# Patient Record
Sex: Male | Born: 1960 | Race: White | Hispanic: No | Smoking: Former smoker
Health system: Southern US, Community
[De-identification: ages and names within clinical notes are randomized; demographics above are authoritative.]

## PROBLEM LIST (undated history)

## (undated) DIAGNOSIS — I10 Essential (primary) hypertension: Secondary | ICD-10-CM

## (undated) DIAGNOSIS — I509 Heart failure, unspecified: Secondary | ICD-10-CM

## (undated) DIAGNOSIS — E119 Type 2 diabetes mellitus without complications: Secondary | ICD-10-CM

## (undated) DIAGNOSIS — M62838 Other muscle spasm: Secondary | ICD-10-CM

## (undated) DIAGNOSIS — F4312 Post-traumatic stress disorder, chronic: Secondary | ICD-10-CM

## (undated) HISTORY — DX: Other muscle spasm: M62.838

## (undated) HISTORY — DX: Type 2 diabetes mellitus without complications: E11.9

## (undated) HISTORY — DX: Post-traumatic stress disorder, chronic: F43.12

## (undated) HISTORY — DX: Heart failure, unspecified: I50.9

---

## 1998-03-02 DIAGNOSIS — E785 Hyperlipidemia, unspecified: Secondary | ICD-10-CM

## 1998-03-02 HISTORY — DX: Hyperlipidemia, unspecified: E78.5

## 2001-03-02 DIAGNOSIS — F32A Depression, unspecified: Secondary | ICD-10-CM

## 2001-03-02 DIAGNOSIS — F419 Anxiety disorder, unspecified: Secondary | ICD-10-CM

## 2001-03-02 DIAGNOSIS — M1990 Unspecified osteoarthritis, unspecified site: Secondary | ICD-10-CM

## 2001-03-02 HISTORY — DX: Anxiety disorder, unspecified: F41.9

## 2001-03-02 HISTORY — DX: Depression, unspecified: F32.A

## 2001-03-02 HISTORY — DX: Unspecified osteoarthritis, unspecified site: M19.90

## 2007-03-03 DIAGNOSIS — G4730 Sleep apnea, unspecified: Secondary | ICD-10-CM

## 2007-03-03 DIAGNOSIS — T7840XA Allergy, unspecified, initial encounter: Secondary | ICD-10-CM

## 2007-03-03 HISTORY — DX: Allergy, unspecified, initial encounter: T78.40XA

## 2007-03-03 HISTORY — DX: Sleep apnea, unspecified: G47.30

## 2012-03-02 DIAGNOSIS — C801 Malignant (primary) neoplasm, unspecified: Secondary | ICD-10-CM

## 2012-03-02 HISTORY — DX: Malignant (primary) neoplasm, unspecified: C80.1

## 2015-03-03 DIAGNOSIS — D689 Coagulation defect, unspecified: Secondary | ICD-10-CM

## 2015-03-03 HISTORY — DX: Coagulation defect, unspecified: D68.9

## 2015-12-23 ENCOUNTER — Emergency Department (HOSPITAL_COMMUNITY)

## 2015-12-23 ENCOUNTER — Emergency Department (HOSPITAL_COMMUNITY)
Admission: EM | Admit: 2015-12-23 | Discharge: 2015-12-23 | Disposition: A | Discharge status: Home / Self Care | Attending: Emergency Medicine | Admitting: Emergency Medicine

## 2015-12-23 ENCOUNTER — Encounter (HOSPITAL_COMMUNITY): Payer: Self-pay | Admitting: Emergency Medicine

## 2015-12-23 DIAGNOSIS — R079 Chest pain, unspecified: Secondary | ICD-10-CM

## 2015-12-23 DIAGNOSIS — R0789 Other chest pain: Secondary | ICD-10-CM | POA: Diagnosis not present

## 2015-12-23 DIAGNOSIS — I10 Essential (primary) hypertension: Secondary | ICD-10-CM | POA: Diagnosis not present

## 2015-12-23 DIAGNOSIS — R072 Precordial pain: Secondary | ICD-10-CM | POA: Diagnosis present

## 2015-12-23 HISTORY — DX: Essential (primary) hypertension: I10

## 2015-12-23 LAB — I-STAT TROPONIN, ED
TROPONIN I, POC: 0 ng/mL (ref 0.00–0.08)
TROPONIN I, POC: 0 ng/mL (ref 0.00–0.08)

## 2015-12-23 LAB — CBC
HEMATOCRIT: 44.7 % (ref 39.0–52.0)
Hemoglobin: 15.5 g/dL (ref 13.0–17.0)
MCH: 30.9 pg (ref 26.0–34.0)
MCHC: 34.7 g/dL (ref 30.0–36.0)
MCV: 89 fL (ref 78.0–100.0)
Platelets: 171 10*3/uL (ref 150–400)
RBC: 5.02 MIL/uL (ref 4.22–5.81)
RDW: 12.3 % (ref 11.5–15.5)
WBC: 7.3 10*3/uL (ref 4.0–10.5)

## 2015-12-23 LAB — BASIC METABOLIC PANEL
ANION GAP: 8 (ref 5–15)
BUN: 17 mg/dL (ref 6–20)
CALCIUM: 9.2 mg/dL (ref 8.9–10.3)
CO2: 26 mmol/L (ref 22–32)
Chloride: 102 mmol/L (ref 101–111)
Creatinine, Ser: 1 mg/dL (ref 0.61–1.24)
GFR calc Af Amer: >60 mL/min (ref >60)
Glucose, Bld: 145 mg/dL — ABNORMAL HIGH (ref 65–99)
POTASSIUM: 4.4 mmol/L (ref 3.5–5.1)
SODIUM: 136 mmol/L (ref 135–145)

## 2015-12-23 NOTE — ED Triage Notes (Signed)
Pt states yesterday he did not feel well he felt like he was tired and this morning at 8am he started having intermittent central chest pain with nausea and left arm and jaw pain. At this time pt only has pain in left jaw.

## 2015-12-23 NOTE — ED Provider Notes (Signed)
[facility] DEPT Provider Note   CSN: [id number] Arrival date & time: 12/23/15  1403     History   Chief Complaint Chief Complaint  Patient presents with  . Chest Pain    HPI Dean Knox is a 55 y.o. male.  Patient presents with malaise starting yesterday and chest pressure radiating to left arm/jaw intermittently for the past day. No exacerbating or reliving factors. No recent cough or fever. No dyspnea. Denies chest pain on arrival. Pain is not exertional.   The history is provided by the patient. No language interpreter was used.  Chest Pain   This is a new problem. The current episode started 12 to 24 hours ago. The problem occurs hourly. The problem has not changed since onset.Associated with: Nothing. The pain is present in the substernal region. The pain is at a severity of 5/10. The pain is moderate. The quality of the pain is described as pressure-like. The pain radiates to the left jaw and left arm. Exacerbated by: No exacerbating factors. Associated symptoms include malaise/fatigue and nausea. Pertinent negatives include no abdominal pain, no cough, no fever, no lower extremity edema and no shortness of breath. He has tried rest for the symptoms. The treatment provided no relief. Risk factors include male gender, lack of exercise, obesity and sedentary lifestyle.  His past medical history is significant for hypertension.  Pertinent negatives for past medical history include no CAD, no MI and no recent injury.  Pertinent negatives for family medical history include: no early MI.  Procedure history is positive for echocardiogram and exercise treadmill test. Past workup comments: Stress test in 2014.    Past Medical History:  Diagnosis Date  . Hypertension     There are no active problems to display for this patient.   No past surgical history on file.     Home Medications    Prior to Admission medications   Medication Sig Start Date End Date Taking?  Authorizing Provider  ALPRAZolam Duanne Moron) 1 MG tablet Take 1 mg by mouth 2 (two) times daily as needed for anxiety.  10/07/15  Yes Historical Provider, MD  butalbital-aspirin-caffeine Acquanetta Chain) 50-325-40 MG capsule Take 2 capsules by mouth 2 (two) times daily as needed for headache (TAKE AT ONSET).   Yes Historical Provider, MD  citalopram (CELEXA) 40 MG tablet Take 40 mg by mouth at bedtime. 10/07/15  Yes Historical Provider, MD  DHS TAR 0.5 % shampoo once a week. 10/11/15  Yes Historical Provider, MD  fexofenadine (ALLEGRA) 180 MG tablet Take 180 mg by mouth daily as needed for allergies or rhinitis.   Yes Historical Provider, MD  fluticasone (FLONASE) 50 MCG/ACT nasal spray Place 2 sprays into both nostrils daily as needed for allergies or rhinitis.   Yes Historical Provider, MD  lisinopril (PRINIVIL,ZESTRIL) 20 MG tablet Take 20 mg by mouth at bedtime. 09/26/15  Yes Historical Provider, MD  promethazine (PHENERGAN) 25 MG tablet Take 25 mg by mouth 2 (two) times daily as needed (for headache-induced nausea).  10/11/15  Yes Historical Provider, MD  traZODone (DESYREL) 100 MG tablet Take 100 mg by mouth at bedtime. 09/26/15  Yes Historical Provider, MD  XARELTO 20 MG TABS tablet Take 20 mg by mouth every morning. 11/13/15  Yes Historical Provider, MD    Family History No family history on file.  Social History Social History  Substance Use Topics  . Smoking status: Not on file  . Smokeless tobacco: Not on file  . Alcohol use Not on file  Allergies   Penicillins   Review of Systems Review of Systems  Constitutional: Positive for fatigue and malaise/fatigue. Negative for fever.  HENT: Negative.   Respiratory: Negative for cough and shortness of breath.   Cardiovascular: Positive for chest pain. Negative for leg swelling.  Gastrointestinal: Positive for nausea. Negative for abdominal pain.  Genitourinary: Negative.   Musculoskeletal: Negative.   Skin: Negative.   Allergic/Immunologic:  Negative for immunocompromised state.  Neurological: Negative.   Hematological: Does not bruise/bleed easily.  Psychiatric/Behavioral: Negative.      Physical Exam Updated Vital Signs BP 150/99 (BP Location: Right Arm)   Pulse (!) 55   Temp 97.7 F (36.5 C) (Oral)   Resp 19   Ht 6\' 4"  (1.93 m)   Wt (!) 145.2 kg   SpO2 99%   BMI 38.95 kg/m   Physical Exam  Constitutional: He is oriented to person, place, and time. He appears well-developed and well-nourished. No distress.  HENT:  Head: Normocephalic and atraumatic.  Eyes: Conjunctivae and EOM are normal. Pupils are equal, round, and reactive to light. No scleral icterus.  Neck: Normal range of motion. Neck supple.  Cardiovascular: Normal rate, regular rhythm, normal heart sounds and intact distal pulses.  Exam reveals no gallop.   No murmur heard. Pulmonary/Chest: Effort normal and breath sounds normal. No respiratory distress. He has no wheezes. He has no rales. He exhibits no tenderness.  Abdominal: Soft. Bowel sounds are normal. He exhibits no distension and no mass. There is no tenderness. There is no guarding.  Musculoskeletal: Normal range of motion. He exhibits no edema.  Neurological: He is alert and oriented to person, place, and time.  5/5 strength of bilateral shoulder flexion/extension, elbow flexion/extension grip strength. 5/5 strength of hip flexion/extension and dorsiflexion/plantarflexion. Sensation intact in all extremities.  Skin: Skin is warm and dry. No rash noted. He is not diaphoretic. No pallor.  Psychiatric: His behavior is normal. Judgment and thought content normal. He exhibits a depressed mood.     ED Treatments / Results  Labs (all labs ordered are listed, but only abnormal results are displayed) Labs Reviewed  BASIC METABOLIC PANEL - Abnormal; Notable for the following:       Result Value   Glucose, Bld 145 (*)    All other components within normal limits  CBC  I-STAT TROPOININ, ED  Randolm Idol, ED    EKG  EKG Interpretation None       Radiology Dg Chest 2 View  Result Date: 12/23/2015 CLINICAL DATA:  Left-sided chest and arm pain for 1 day. Hypertension. EXAM: CHEST  2 VIEW COMPARISON:  None. FINDINGS: The heart size and mediastinal contours are within normal limits. Both lungs are clear. The visualized skeletal structures are unremarkable. IMPRESSION: No active cardiopulmonary disease. Electronically Signed   By: Earle Gell M.D.   On: 12/23/2015 15:06    Procedures Procedures (including critical care time)  Medications Ordered in ED Medications - No data to display   Initial Impression / Assessment and Plan / ED Course  I have reviewed the triage vital signs and the nursing notes.  Pertinent labs & imaging results that were available during my care of the patient were reviewed by me and considered in my medical decision making (see chart for details).  Clinical Course    Patient presents with one day of atypical chest pain. He also notes two days of general malaise without other symptoms. He is well-appearing on arrival with normal vital signs. EKG is  unremarkable without signs of any ischemic changes, normal ST segments and no T wave changes. Chest x-ray unremarkable without any signs of pneumonia, pneumothorax, cardiomegaly, pulmonary edema. HEAR score is 3 indicating low risk for ACS. Troponins at 0 and 3 hours were negative. Labs were unremarkable. Discussed return precautions with patient for signs of worsening symptoms. Encouraged him to follow up with his PCP for risk stratification. He expressed understanding of these instructions and is in good condition for discharge home.  Final Clinical Impressions(s) / ED Diagnoses   Final diagnoses:  Nonspecific chest pain    New Prescriptions Discharge Medication List as of 12/23/2015  6:10 PM       Harlin Heys, MD 12/23/15 1904    Veryl Speak, MD 12/23/15 [phone]

## 2019-01-05 ENCOUNTER — Encounter (HOSPITAL_COMMUNITY): Payer: Self-pay

## 2019-01-05 ENCOUNTER — Other Ambulatory Visit: Payer: Self-pay

## 2019-01-05 ENCOUNTER — Ambulatory Visit (HOSPITAL_COMMUNITY)
Admission: EM | Admit: 2019-01-05 | Discharge: 2019-01-05 | Disposition: A | Discharge status: Home / Self Care | Attending: Family Medicine | Admitting: Family Medicine

## 2019-01-05 DIAGNOSIS — J309 Allergic rhinitis, unspecified: Secondary | ICD-10-CM

## 2019-01-05 MED ORDER — FLUTICASONE PROPIONATE 50 MCG/ACT NA SUSP: 1.0000 | Freq: Every day | NASAL | 0 refills | Status: DC

## 2019-01-05 MED ORDER — METHYLPREDNISOLONE SODIUM SUCC 125 MG IJ SOLR
INTRAMUSCULAR | Status: AC
  Filled 2019-01-05: qty 2

## 2019-01-05 MED ORDER — METHYLPREDNISOLONE SODIUM SUCC 125 MG IJ SOLR
125.0000 mg | Freq: Once | INTRAMUSCULAR | Status: AC
  Administered 2019-01-05: 125 mg via INTRAMUSCULAR

## 2019-01-05 MED ORDER — FEXOFENADINE HCL 180 MG PO TABS: 180.0000 mg | ORAL_TABLET | Freq: Every day | ORAL | 0 refills | Status: DC

## 2019-01-05 MED ORDER — METHYLPREDNISOLONE 4 MG PO TBPK: ORAL_TABLET | ORAL | 0 refills | Status: DC

## 2019-01-05 MED ORDER — OLOPATADINE HCL 0.1 % OP SOLN: 1.0000 [drp] | Freq: Two times a day (BID) | OPHTHALMIC | 12 refills | Status: DC

## 2019-01-05 NOTE — ED Provider Notes (Signed)
[facility]    CSN: [id number] Arrival date & time: 01/05/19  1645      History   Chief Complaint Chief Complaint  Patient presents with  . APPT (1510) runny nose, eyes itching    HPI Dean Knox is a 58 y.o. male history of HTN, allergic rhinitis, presenting today for evaluation of congestion and allergies.  Patient states over the past 2 days he has developed significant watery/itchy eyes as well as nasal congestion.  Feels very similar to his allergy flares in the past.  He has been using Allegra without relief.  States that if he is out of his Flonase and the typical eyedrops he uses.  He also states that he typically has significant improvement in symptoms with steroids and typically gets a Solu-Medrol injection.  He denies any fevers chills or body aches.  Denies any cough, shortness of breath or chest pain.  Denies known exposure to Covid.  HPI  Past Medical History:  Diagnosis Date  . Hypertension     There are no active problems to display for this patient.   History reviewed. No pertinent surgical history.     Home Medications    Prior to Admission medications   Medication Sig Start Date End Date Taking? Authorizing Provider  butalbital-aspirin-caffeine [facility]) 50-325-40 MG capsule Take 2 capsules by mouth 2 (two) times daily as needed for headache (TAKE AT ONSET).    [provider]  citalopram (CELEXA) 40 MG tablet Take 40 mg by mouth at bedtime. 10/07/15   [provider]  DHS TAR 0.5 % shampoo once a week. 10/11/15   [provider]  fexofenadine (ALLEGRA) 180 MG tablet Take 1 tablet (180 mg total) by mouth daily. 01/05/19   Wieters, Hallie C, PA-C  fluticasone (FLONASE) 50 MCG/ACT nasal spray Place 1-2 sprays into both nostrils daily for 7 days. 01/05/19 01/12/19  Wieters, Hallie C, PA-C  lisinopril (PRINIVIL,ZESTRIL) 20 MG tablet Take 20 mg by mouth at bedtime. 09/26/15   [provider]  methylPREDNISolone  (MEDROL DOSEPAK) 4 MG TBPK tablet Take as directed, begin 11/6 in morning 01/05/19   Wieters, Hallie C, PA-C  olopatadine (PATANOL) 0.1 % ophthalmic solution Place 1 drop into both eyes 2 (two) times daily. 01/05/19   Wieters, Hallie C, PA-C  promethazine (PHENERGAN) 25 MG tablet Take 25 mg by mouth 2 (two) times daily as needed (for headache-induced nausea).  10/11/15   [provider]  traZODone (DESYREL) 100 MG tablet Take 100 mg by mouth at bedtime. 09/26/15   [provider]  XARELTO 20 MG TABS tablet Take 20 mg by mouth every morning. 11/13/15   [provider]    Family History Family History  Problem Relation Age of Onset  . Healthy Mother   . Healthy Father     Social History Social History   Tobacco Use  . Smoking status: Never Smoker  . Smokeless tobacco: Never Used  Substance Use Topics  . Alcohol use: Yes    Comment: occ  . Drug use: Not on file     Allergies   Penicillins   Review of Systems Review of Systems  Constitutional: Negative for activity change, appetite change, chills, fatigue and fever.  HENT: Positive for congestion, rhinorrhea, sinus pressure and sneezing. Negative for ear pain, sore throat and trouble swallowing.   Eyes: Positive for discharge and itching. Negative for redness.  Respiratory: Negative for cough, chest tightness and shortness of breath.   Cardiovascular: Negative for  chest pain.  Gastrointestinal: Negative for abdominal pain, diarrhea, nausea and vomiting.  Musculoskeletal: Negative for myalgias.  Skin: Negative for rash.  Neurological: Negative for dizziness, light-headedness and headaches.     Physical Exam Triage Vital Signs ED Triage Vitals  Enc Vitals Group     BP --      Pulse Rate 01/05/19 1724 87     Resp 01/05/19 1724 16     Temp 01/05/19 1724 98.2 F (36.8 C)     Temp Source 01/05/19 1724 Oral     SpO2 01/05/19 1724 97 %     Weight --      Height --      Head Circumference --       Peak Flow --      Pain Score 01/05/19 1727 0     Pain Loc --      Pain Edu? --      Excl. in [place]? --    No data found.  Updated Vital Signs Pulse 87   Temp 98.2 F (36.8 C) (Oral)   Resp 16   SpO2 97%   Visual Acuity Right Eye Distance:   Left Eye Distance:   Bilateral Distance:    Right Eye Near:   Left Eye Near:    Bilateral Near:     Physical Exam Vitals signs and nursing note reviewed.  Constitutional:      Appearance: He is well-developed.  HENT:     Head: Normocephalic and atraumatic.     Ears:     Comments: Bilateral ears without tenderness to palpation of external auricle, tragus and mastoid, EAC's without erythema or swelling, TM's with good bony landmarks and cone of light. Non erythematous.     Nose:     Comments: Nasal mucosa erythematous, rhinorrhea present    Mouth/Throat:     Comments: Oral mucosa pink and moist, no tonsillar enlargement or exudate. Posterior pharynx patent and nonerythematous, no uvula deviation or swelling. Normal phonation.  Eyes:     Extraocular Movements: Extraocular movements intact.     Conjunctiva/sclera: Conjunctivae normal.     Pupils: Pupils are equal, round, and reactive to light.  Neck:     Musculoskeletal: Neck supple.  Cardiovascular:     Rate and Rhythm: Normal rate and regular rhythm.     Heart sounds: No murmur.  Pulmonary:     Effort: Pulmonary effort is normal. No respiratory distress.     Breath sounds: Normal breath sounds.     Comments: Breathing comfortably at rest, CTABL, no wheezing, rales or other adventitious sounds auscultated Abdominal:     Palpations: Abdomen is soft.     Tenderness: There is no abdominal tenderness.  Skin:    General: Skin is warm and dry.  Neurological:     Mental Status: He is alert.      UC Treatments / Results  Labs (all labs ordered are listed, but only abnormal results are displayed) Labs Reviewed - No data to display  EKG   Radiology No results found.   Procedures Procedures (including critical care time)  Medications Ordered in UC Medications  methylPREDNISolone sodium succinate (SOLU-MEDROL) 125 mg/2 mL injection 125 mg (has no administration in time range)    Initial Impression / Assessment and Plan / UC Course  I have reviewed the triage vital signs and the nursing notes.  Pertinent labs & imaging results that were available during my care of the patient were reviewed by me and considered in my medical  decision making (see chart for details).     2 days of URI symptoms.  Symptoms are most consistent with allergic rhinitis, but given current Covid pandemic did offer Covid testing, patient declined.  Will proceed with allergic rhinitis treatment.  Will provide Solu-Medrol and Medrol Dosepak as this is significantly outpatient in the past.  We will continue symptomatic and supportive care with Allegra, Flonase as well as olopatadine.  Continue to monitor, advised to follow-up if developing symptoms deviating from typical allergy symptoms.  Discussed strict return precautions. Patient verbalized understanding and is agreeable with plan.  Final Clinical Impressions(s) / UC Diagnoses   Final diagnoses:  Allergic rhinitis, unspecified seasonality, unspecified trigger     Discharge Instructions     We gave you solumedrol today Begin medrol dose pack taper in the morning Continue allegra, flonase Olopatadine as needed  Follow up if symptoms not resolving   ED Prescriptions    Medication Sig Dispense Auth. Provider   fluticasone (FLONASE) 50 MCG/ACT nasal spray Place 1-2 sprays into both nostrils daily for 7 days. 1 g Wieters, Hallie C, PA-C   olopatadine (PATANOL) 0.1 % ophthalmic solution Place 1 drop into both eyes 2 (two) times daily. 5 mL Wieters, Hallie C, PA-C   methylPREDNISolone (MEDROL DOSEPAK) 4 MG TBPK tablet Take as directed, begin 11/6 in morning 21 tablet Wieters, Hallie C, PA-C   fexofenadine (ALLEGRA) 180 MG  tablet Take 1 tablet (180 mg total) by mouth daily. 30 tablet Wieters, Finley C, PA-C     PDMP not reviewed this encounter.   Janith Lima, PA-C 01/05/19 1751

## 2019-01-05 NOTE — ED Triage Notes (Signed)
Pt presents to UC w/ c/o runny nose, eyes itching since yesterday. Pt states he has these symptoms every year and gets a solumedrol which helps. 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

## 2019-01-05 NOTE — Discharge Instructions (Signed)
We gave you solumedrol today Begin medrol dose pack taper in the morning Continue allegra, flonase Olopatadine as needed  Follow up if symptoms not resolving

## 2019-02-10 ENCOUNTER — Encounter (HOSPITAL_COMMUNITY): Payer: Self-pay | Admitting: Family Medicine

## 2019-02-10 ENCOUNTER — Other Ambulatory Visit: Payer: Self-pay

## 2019-02-10 ENCOUNTER — Ambulatory Visit (HOSPITAL_COMMUNITY)
Admission: EM | Admit: 2019-02-10 | Discharge: 2019-02-10 | Disposition: A | Discharge status: Home / Self Care | Attending: Family Medicine | Admitting: Family Medicine

## 2019-02-10 DIAGNOSIS — L299 Pruritus, unspecified: Secondary | ICD-10-CM | POA: Diagnosis present

## 2019-02-10 LAB — CBC WITH DIFFERENTIAL/PLATELET
Abs Immature Granulocytes: 0.08 10*3/uL — ABNORMAL HIGH (ref 0.00–0.07)
Basophils Absolute: 0.2 10*3/uL — ABNORMAL HIGH (ref 0.0–0.1)
Basophils Relative: 2 %
Eosinophils Absolute: 0.6 10*3/uL — ABNORMAL HIGH (ref 0.0–0.5)
Eosinophils Relative: 7 %
HCT: 48.5 % (ref 39.0–52.0)
Hemoglobin: 16.3 g/dL (ref 13.0–17.0)
Immature Granulocytes: 1 %
Lymphocytes Relative: 25 %
Lymphs Abs: 2.1 10*3/uL (ref 0.7–4.0)
MCH: 29.9 pg (ref 26.0–34.0)
MCHC: 33.6 g/dL (ref 30.0–36.0)
MCV: 89 fL (ref 80.0–100.0)
Monocytes Absolute: 0.6 10*3/uL (ref 0.1–1.0)
Monocytes Relative: 7 %
Neutro Abs: 5 10*3/uL (ref 1.7–7.7)
Neutrophils Relative %: 58 %
Platelets: 186 10*3/uL (ref 150–400)
RBC: 5.45 MIL/uL (ref 4.22–5.81)
RDW: 11.7 % (ref 11.5–15.5)
WBC: 8.5 10*3/uL (ref 4.0–10.5)
nRBC: 0 % (ref 0.0–0.2)

## 2019-02-10 LAB — COMPREHENSIVE METABOLIC PANEL
ALT: 30 U/L (ref 0–44)
AST: 20 U/L (ref 15–41)
Albumin: 4 g/dL (ref 3.5–5.0)
Alkaline Phosphatase: 72 U/L (ref 38–126)
Anion gap: 10 (ref 5–15)
BUN: 13 mg/dL (ref 6–20)
CO2: 27 mmol/L (ref 22–32)
Calcium: 9 mg/dL (ref 8.9–10.3)
Chloride: 104 mmol/L (ref 98–111)
Creatinine, Ser: 0.86 mg/dL (ref 0.61–1.24)
GFR calc Af Amer: >60 mL/min (ref >60)
GFR calc non Af Amer: >60 mL/min (ref >60)
Glucose, Bld: 148 mg/dL — ABNORMAL HIGH (ref 70–99)
Potassium: 3.7 mmol/L (ref 3.5–5.1)
Sodium: 141 mmol/L (ref 135–145)
Total Bilirubin: 0.6 mg/dL (ref 0.3–1.2)
Total Protein: 6.5 g/dL (ref 6.5–8.1)

## 2019-02-10 LAB — POCT URINALYSIS DIP (DEVICE)
Bilirubin Urine: NEGATIVE
Glucose, UA: NEGATIVE mg/dL
Hgb urine dipstick: NEGATIVE
Ketones, ur: NEGATIVE mg/dL
Leukocytes,Ua: NEGATIVE
Nitrite: NEGATIVE
Protein, ur: NEGATIVE mg/dL
Specific Gravity, Urine: >=1.03 (ref 1.005–1.030)
Urobilinogen, UA: 0.2 mg/dL (ref 0.0–1.0)
pH: 5.5 (ref 5.0–8.0)

## 2019-02-10 LAB — TSH: TSH: 1.603 u[IU]/mL (ref 0.350–4.500)

## 2019-02-10 MED ORDER — METHYLPREDNISOLONE SODIUM SUCC 125 MG IJ SOLR
125.0000 mg | Freq: Once | INTRAMUSCULAR | Status: AC
  Administered 2019-02-10: 125 mg via INTRAMUSCULAR

## 2019-02-10 MED ORDER — TRIAMCINOLONE ACETONIDE 0.1 % EX CREA: 1.0000 "application " | TOPICAL_CREAM | Freq: Two times a day (BID) | CUTANEOUS | 0 refills | Status: DC

## 2019-02-10 MED ORDER — HYDROXYZINE HCL 25 MG PO TABS: 25.0000 mg | ORAL_TABLET | Freq: Four times a day (QID) | ORAL | 0 refills | Status: DC

## 2019-02-10 MED ORDER — METHYLPREDNISOLONE SODIUM SUCC 125 MG IJ SOLR
INTRAMUSCULAR | Status: AC
  Filled 2019-02-10: qty 2

## 2019-02-10 NOTE — Discharge Instructions (Addendum)
Steroid injection given here in clinic. Doing a few labs to check a few things to ensure there is not an underlying problem causing your itching. I am sending in hydroxyzine to use for itching, be aware this may make you drowsy. If your lab work comes back normal this could be stress related I am very sorry for your situation and everything you are going through.

## 2019-02-10 NOTE — ED Triage Notes (Signed)
Pt states his body is being itchy x 4 days. Pt reports he had  Depo-Medrol shot 3 days ago without relief. Pt reports his wife passed yesterday and he was told, the itchiness is related to stress.

## 2019-02-13 NOTE — ED Provider Notes (Signed)
[facility]    CSN: [id number] Arrival date & time: 02/10/19  [id number]      History   Chief Complaint Chief Complaint  Patient presents with  . Itchy    HPI Dean Knox is a 58 y.o. male.   Patient is a 58 year old male with past medical history of hypertension.  He presents today with severe pruritus x4 days.  Symptoms have been constant.  He has been taking Benadryl and was given a Depo-Medrol shot about 3 days ago without any relief.  Reporting symptoms started after wife passed away a week ago.  No obvious rash. Under tremendous amount of stress with wife passing.    Denies any fever, joint pain. Denies any recent changes in lotions, detergents, foods or other possible irritants. No recent travel. Nobody else at home has the rash. Patient has been outside but denies any contact with plants or insects. No new foods or medications.   ROS per HPI        Past Medical History:  Diagnosis Date  . Hypertension     There are no problems to display for this patient.   History reviewed. No pertinent surgical history.     Home Medications    Prior to Admission medications   Medication Sig Start Date End Date Taking? Authorizing Provider  butalbital-aspirin-caffeine [facility]) 50-325-40 MG capsule Take 2 capsules by mouth 2 (two) times daily as needed for headache (TAKE AT ONSET).    [provider]  citalopram (CELEXA) 40 MG tablet Take 40 mg by mouth at bedtime. 10/07/15   [provider]  DHS TAR 0.5 % shampoo once a week. 10/11/15   [provider]  fexofenadine (ALLEGRA) 180 MG tablet Take 1 tablet (180 mg total) by mouth daily. 01/05/19   Wieters, Hallie C, PA-C  fluticasone (FLONASE) 50 MCG/ACT nasal spray Place 1-2 sprays into both nostrils daily for 7 days. 01/05/19 01/12/19  Wieters, Hallie C, PA-C  hydrOXYzine (ATARAX/VISTARIL) 25 MG tablet Take 1 tablet (25 mg total) by mouth every 6 (six) hours. 02/10/19   Briony Parveen, Tressia Miners A, NP   lisinopril (PRINIVIL,ZESTRIL) 20 MG tablet Take 20 mg by mouth at bedtime. 09/26/15   [provider]  methylPREDNISolone (MEDROL DOSEPAK) 4 MG TBPK tablet Take as directed, begin 11/6 in morning 01/05/19   Wieters, Hallie C, PA-C  olopatadine (PATANOL) 0.1 % ophthalmic solution Place 1 drop into both eyes 2 (two) times daily. 01/05/19   Wieters, Hallie C, PA-C  promethazine (PHENERGAN) 25 MG tablet Take 25 mg by mouth 2 (two) times daily as needed (for headache-induced nausea).  10/11/15   [provider]  traZODone (DESYREL) 100 MG tablet Take 100 mg by mouth at bedtime. 09/26/15   [provider]  triamcinolone cream (KENALOG) 0.1 % Apply 1 application topically 2 (two) times daily. 02/10/19   Manpreet Strey A, NP  XARELTO 20 MG TABS tablet Take 20 mg by mouth every morning. 11/13/15   [provider]    Family History Family History  Problem Relation Age of Onset  . Healthy Mother   . Healthy Father     Social History Social History   Tobacco Use  . Smoking status: Never Smoker  . Smokeless tobacco: Never Used  Substance Use Topics  . Alcohol use: Yes    Comment: occ  . Drug use: Not on file     Allergies   Penicillins   Review of Systems Review of Systems   Physical Exam Triage  Vital Signs ED Triage Vitals  Enc Vitals Group     BP 02/10/19 0850 (!) 153/89     Pulse Rate 02/10/19 0850 70     Resp 02/10/19 0850 17     Temp 02/10/19 0850 98.3 F (36.8 C)     Temp Source 02/10/19 0850 Oral     SpO2 02/10/19 0850 98 %     Weight --      Height --      Head Circumference --      Peak Flow --      Pain Score 02/10/19 0845 8     Pain Loc --      Pain Edu? --      Excl. in [place]? --    No data found.  Updated Vital Signs BP (!) 153/89 (BP Location: Left Arm)   Pulse 70   Temp 98.3 F (36.8 C) (Oral)   Resp 17   SpO2 98%   Visual Acuity Right Eye Distance:   Left Eye Distance:   Bilateral Distance:    Right Eye Near:   Left  Eye Near:    Bilateral Near:     Physical Exam Vitals and nursing note reviewed.  Constitutional:      General: He is not in acute distress.    Appearance: He is not ill-appearing, toxic-appearing or diaphoretic.  HENT:     Head: Normocephalic and atraumatic.     Nose: Nose normal.     Mouth/Throat:     Pharynx: Oropharynx is clear.  Eyes:     Conjunctiva/sclera: Conjunctivae normal.  Cardiovascular:     Rate and Rhythm: Normal rate.  Pulmonary:     Effort: Pulmonary effort is normal.  Musculoskeletal:        General: Normal range of motion.     Cervical back: Normal range of motion.  Skin:    General: Skin is warm and dry.     Comments: Generalized excoriations and redness to skin but no obvious rash  Neurological:     Mental Status: He is alert.  Psychiatric:     Comments: Mildly anxious.        UC Treatments / Results  Labs (all labs ordered are listed, but only abnormal results are displayed) Labs Reviewed  CBC WITH DIFFERENTIAL/PLATELET - Abnormal; Notable for the following components:      Result Value   Eosinophils Absolute 0.6 (*)    Basophils Absolute 0.2 (*)    Abs Immature Granulocytes 0.08 (*)    All other components within normal limits  COMPREHENSIVE METABOLIC PANEL - Abnormal; Notable for the following components:   Glucose, Bld 148 (*)    All other components within normal limits  TSH  POCT URINALYSIS DIP (DEVICE)    EKG   Radiology No results found.  Procedures Procedures (including critical care time)  Medications Ordered in UC Medications  methylPREDNISolone sodium succinate (SOLU-MEDROL) 125 mg/2 mL injection 125 mg (125 mg Intramuscular Given 02/10/19 0916)    Initial Impression / Assessment and Plan / UC Course  I have reviewed the triage vital signs and the nursing notes.  Pertinent labs & imaging results that were available during my care of the patient were reviewed by me and considered in my medical decision making (see  chart for details).     Pruritus without obvious rash.-CBC and CMP here normal today. Most likely the pruritus is psychogenic related to passing of his wife. Solu-Medrol given here in clinic. Sent home with triamcinolone  cream.  Will give hydroxyzine to help with itching and mild anxiety. Follow up as needed for continued or worsening symptoms  Final Clinical Impressions(s) / UC Diagnoses   Final diagnoses:  Pruritus     Discharge Instructions     Steroid injection given here in clinic. Doing a few labs to check a few things to ensure there is not an underlying problem causing your itching. I am sending in hydroxyzine to use for itching, be aware this may make you drowsy. If your lab work comes back normal this could be stress related I am very sorry for your situation and everything you are going through.     ED Prescriptions    Medication Sig Dispense Auth. Provider   hydrOXYzine (ATARAX/VISTARIL) 25 MG tablet Take 1 tablet (25 mg total) by mouth every 6 (six) hours. 30 tablet Milana Salay A, NP   triamcinolone cream (KENALOG) 0.1 % Apply 1 application topically 2 (two) times daily. 30 g Loura Halt A, NP     PDMP not reviewed this encounter.   Orvan July, NP 02/13/19 [phone]

## 2019-06-19 ENCOUNTER — Other Ambulatory Visit (HOSPITAL_COMMUNITY)

## 2019-06-22 ENCOUNTER — Ambulatory Visit (HOSPITAL_BASED_OUTPATIENT_CLINIC_OR_DEPARTMENT_OTHER): Admission: RE | Admit: 2019-06-22 | Source: Home / Self Care | Admitting: Orthopaedic Surgery

## 2019-06-22 ENCOUNTER — Encounter (HOSPITAL_BASED_OUTPATIENT_CLINIC_OR_DEPARTMENT_OTHER): Admission: RE | Payer: Self-pay | Source: Home / Self Care

## 2019-06-22 SURGERY — TENNIS ELBOW RELEASE/NIRSCHEL PROCEDURE
Anesthesia: Choice | Site: Elbow | Laterality: Left

## 2019-11-30 NOTE — Progress Notes (Signed)
[place] [place] Phone: [phone] Subjective:   Fontaine No, am serving as a scribe for Dr. Hulan Saas. This visit occurred during the SARS-CoV-2 public health emergency.  Safety protocols were in place, including screening questions prior to the visit, additional usage of staff PPE, and extensive cleaning of exam room while observing appropriate contact time as indicated for disinfecting solutions.   I'm seeing this patient by the request  of:  [facility]  CC: Neck and neck pain   [id number]  ALAN DRUMMER is a 59 y.o. male coming in with complaint of neck and back pain. Patient states that he has had neck pain for past 4 weeks in right side of neck. Pain with rotation to the left. Denies any radiating symptoms. Does not work on posture. Patient states that it is fairly severe at this time. Stopping him from certain activities. Patient states anytime he tries to rotate his head to the right seems to have increasing discomfort. Denies any weakness of the arm though.  Patient also has lateral epicondylitis. Was suppose to have surgery in April 2020. Cancelled due to the loss of his wife due to cancer.   Patient also complains of pain deep in lateral left leg. Pain with FABER. MRI October 7th. Has flexeril for pain. Unable to take NSAIDs.        Past Medical History:  Diagnosis Date  . Hypertension    No past surgical history on file. Social History   Socioeconomic History  . Marital status: Widowed    Spouse name: Not on file  . Number of children: Not on file  . Years of education: Not on file  . Highest education level: Not on file  Occupational History  . Not on file  Tobacco Use  . Smoking status: Never Smoker  . Smokeless tobacco: Never Used  Substance and Sexual Activity  . Alcohol use: Yes    Comment: occ  . Drug use: Not on file  . Sexual activity: Not on file  Other Topics  Concern  . Not on file  Social History Narrative  . Not on file   Social Determinants of Health   Financial Resource Strain:   . Difficulty of Paying Living Expenses: Not on file  Food Insecurity:   . Worried About Charity fundraiser in the Last Year: Not on file  . Ran Out of Food in the Last Year: Not on file  Transportation Needs:   . Lack of Transportation (Medical): Not on file  . Lack of Transportation (Non-Medical): Not on file  Physical Activity:   . Days of Exercise per Week: Not on file  . Minutes of Exercise per Session: Not on file  Stress:   . Feeling of Stress : Not on file  Social Connections:   . Frequency of Communication with Friends and Family: Not on file  . Frequency of Social Gatherings with Friends and Family: Not on file  . Attends Religious Services: Not on file  . Active Member of Clubs or Organizations: Not on file  . Attends Archivist Meetings: Not on file  . Marital Status: Not on file   Allergies  Allergen Reactions  . Penicillins Rash    Has patient had a PCN reaction causing immediate rash, facial/tongue/throat swelling, SOB or lightheadedness with hypotension: Yes Has patient had a PCN reaction causing severe rash involving mucus membranes or skin necrosis: No Has  patient had a PCN reaction that required hospitalization No Has patient had a PCN reaction occurring within the last 10 years: No If all of the above answers are "NO", then may proceed with Cephalosporin use.    Family History  Problem Relation Age of Onset  . Healthy Mother   . Healthy Father     Current Outpatient Medications (Endocrine & Metabolic):  .  methylPREDNISolone (MEDROL DOSEPAK) 4 MG TBPK tablet, Take as directed, begin 11/6 in morning .  predniSONE (DELTASONE) 20 MG tablet, Take 2 tablets (40 mg total) by mouth daily with breakfast.  Current Outpatient Medications (Cardiovascular):  .  lisinopril (PRINIVIL,ZESTRIL) 20 MG tablet, Take 20 mg by mouth  at bedtime.  Current Outpatient Medications (Respiratory):  .  fexofenadine (ALLEGRA) 180 MG tablet, Take 1 tablet (180 mg total) by mouth daily. .  promethazine (PHENERGAN) 25 MG tablet, Take 25 mg by mouth 2 (two) times daily as needed (for headache-induced nausea).  .  fluticasone (FLONASE) 50 MCG/ACT nasal spray, Place 1-2 sprays into both nostrils daily for 7 days.  Current Outpatient Medications (Analgesics):  .  butalbital-aspirin-caffeine (FIORINAL) 50-325-40 MG capsule, Take 2 capsules by mouth 2 (two) times daily as needed for headache (TAKE AT ONSET).  Current Outpatient Medications (Hematological):  Marland Kitchen  XARELTO 20 MG TABS tablet, Take 20 mg by mouth every morning.  Current Outpatient Medications (Other):  .  citalopram (CELEXA) 40 MG tablet, Take 40 mg by mouth at bedtime. .  DHS TAR 0.5 % shampoo, once a week. .  hydrOXYzine (ATARAX/VISTARIL) 25 MG tablet, Take 1 tablet (25 mg total) by mouth every 6 (six) hours. Marland Kitchen  olopatadine (PATANOL) 0.1 % ophthalmic solution, Place 1 drop into both eyes 2 (two) times daily. .  traZODone (DESYREL) 100 MG tablet, Take 100 mg by mouth at bedtime. .  triamcinolone cream (KENALOG) 0.1 %, Apply 1 application topically 2 (two) times daily.   Reviewed prior external information including notes and imaging from  primary care provider As well as notes that were available from care everywhere and other healthcare systems.  Past medical history, social, surgical and family history all reviewed in electronic medical record.  No pertanent information unless stated regarding to the chief complaint.   Review of Systems:  No headache, visual changes, nausea, vomiting, diarrhea, constipation, dizziness, abdominal pain, skin rash, fevers, chills, night sweats, weight loss, swollen lymph nodes, body aches, joint swelling, chest pain, shortness of breath, mood changes. POSITIVE muscle aches  Objective  Blood pressure 126/82, pulse 80, height 6\' 4"  (1.93  m), weight (!) 312 lb (141.5 kg), SpO2 95 %.   General: No apparent distress alert and oriented x3 mood and affect normal, dressed appropriately.  HEENT: Pupils equal, extraocular movements intact  Respiratory: Patient's speak in full sentences and does not appear short of breath  Cardiovascular: No lower extremity edema, non tender, no erythema  Neuro: Cranial nerves II through XII are intact, neurovascularly intact in all extremities with 2+ DTRs and 2+ pulses.  Gait normal with good balance and coordination.  MSK:  Neck exam does have significant loss of lordosis. Increasing pain with any extension of the neck for any rotation to the right. Does have tightness with right rotation compared to left. Negative Spurling's though the patient does have more pain. 5-5 strength of the upper extremities otherwise.  Osteopathic findings C6 flexed rotated and side bent right T4 extended rotated and side bent right    Impression and Recommendations:  The above documentation has been reviewed and is accurate and complete Lyndal Pulley, DO

## 2019-12-01 ENCOUNTER — Ambulatory Visit (INDEPENDENT_AMBULATORY_CARE_PROVIDER_SITE_OTHER): Admitting: Family Medicine

## 2019-12-01 ENCOUNTER — Ambulatory Visit (INDEPENDENT_AMBULATORY_CARE_PROVIDER_SITE_OTHER)

## 2019-12-01 ENCOUNTER — Encounter: Payer: Self-pay | Admitting: Family Medicine

## 2019-12-01 ENCOUNTER — Other Ambulatory Visit: Payer: Self-pay

## 2019-12-01 VITALS — BP 126/82 | HR 80 | Ht 76.0 in | Wt 312.0 lb

## 2019-12-01 DIAGNOSIS — M62838 Other muscle spasm: Secondary | ICD-10-CM | POA: Diagnosis not present

## 2019-12-01 DIAGNOSIS — M542 Cervicalgia: Secondary | ICD-10-CM

## 2019-12-01 DIAGNOSIS — M999 Biomechanical lesion, unspecified: Secondary | ICD-10-CM | POA: Diagnosis not present

## 2019-12-01 MED ORDER — PREDNISONE 20 MG PO TABS: 40.0000 mg | ORAL_TABLET | Freq: Every day | ORAL | 0 refills | Status: DC

## 2019-12-01 NOTE — Assessment & Plan Note (Signed)
   Decision today to treat with OMT was based on Physical Exam  After verbal consent patient was treated with HVLA, ME, FPR techniques in cervical, thoracic areas, all areas are chronic   Patient tolerated the procedure well with improvement in symptoms  Patient given exercises, stretches and lifestyle modifications  See medications in patient instructions if given  Patient will follow up in 4-8 weeks

## 2019-12-01 NOTE — Assessment & Plan Note (Signed)
Patient has what appears to be a significant neck spasm on the right side of the neck. Attempted osteopathic manipulation with some very minimal improvement immediately. We discussed with patient about icing regimen, home exercise, which activities to do which wants to avoid. Patient is to increase activity slowly. Patient given prednisone secondary to the amount of pain and patient does have a golfing tournament coming up. I do not see any signs of any radicular symptoms at the moment but patient does have some gabapentin that he can take at home. Avoid anti-inflammatories with patient being on blood thinner. X-rays and neck pending. Worsening pain will consider formal physical therapy. Seeing patient back again and will discuss his lower back once MRI is done as well.

## 2019-12-01 NOTE — Patient Instructions (Signed)
Neck xray Prednisone 40 mg for 5 days Take gabapentin only at night Exercises 3x a week See me in 4-6 weeks

## 2019-12-06 ENCOUNTER — Other Ambulatory Visit: Payer: Self-pay

## 2019-12-06 ENCOUNTER — Telehealth: Payer: Self-pay | Admitting: Family Medicine

## 2019-12-06 MED ORDER — GABAPENTIN 100 MG PO CAPS: 200.0000 mg | ORAL_CAPSULE | Freq: Every day | ORAL | 0 refills | Status: DC

## 2019-12-06 NOTE — Telephone Encounter (Signed)
Left message for patient to call back to discuss medication as an option.

## 2019-12-06 NOTE — Telephone Encounter (Signed)
Spoke with patient. Medication called in to pharmacy.

## 2019-12-06 NOTE — Telephone Encounter (Signed)
Patient called stating that he has not noticed any improvement since his visit. He asked if there was something else he could try?  Please advise.

## 2019-12-06 NOTE — Telephone Encounter (Signed)
Add 200mg  of gabapentin as well if he would want

## 2019-12-07 ENCOUNTER — Ambulatory Visit: Admitting: Family Medicine

## 2019-12-14 ENCOUNTER — Ambulatory Visit: Admitting: Family Medicine

## 2020-01-08 NOTE — Progress Notes (Signed)
[place] Phone: [phone] Subjective:   Fontaine No, am serving as a scribe for Dr. Hulan Saas. This visit occurred during the SARS-CoV-2 public health emergency.  Safety protocols were in place, including screening questions prior to the visit, additional usage of staff PPE, and extensive cleaning of exam room while observing appropriate contact time as indicated for disinfecting solutions.   I'm seeing this patient by the request  of:  [place]  CC: Neck pain follow-up  [id number]  EMARI DEMMER is a 59 y.o. male coming in with complaint of back and neck pain. OMT 12/01/2019. Patient states that he has not had much improvement since last visit. Pain is in right side of neck and into shoulder. Pain is dull especially in the mornings. Pain improves throughout the day. Has been doing stretches.   Having umbilical hernia that he will have surgery on December 10th.  States that they do not need to use mesh  Medications patient has been prescribed: Gabapentin  Taking:       X-rays of the cervical spine were taken last time and independently visualized today by me.  X-rays show that patient does have some mild muscle spasm and very mild lower cervical facet arthropathy otherwise fairly unremarkable.  Reviewed prior external information including notes and imaging from previsou exam, outside providers and external EMR if available.   As well as notes that were available from care everywhere and other healthcare systems.  Past medical history, social, surgical and family history all reviewed in electronic medical record.  No pertanent information unless stated regarding to the chief complaint.   Past Medical History:  Diagnosis Date  . Hypertension     Allergies  Allergen Reactions  . Penicillins Rash    Has patient had a PCN reaction causing immediate rash, facial/tongue/throat  swelling, SOB or lightheadedness with hypotension: Yes Has patient had a PCN reaction causing severe rash involving mucus membranes or skin necrosis: No Has patient had a PCN reaction that required hospitalization No Has patient had a PCN reaction occurring within the last 10 years: No If all of the above answers are "NO", then may proceed with Cephalosporin use.      Review of Systems:  No headache, visual changes, nausea, vomiting, diarrhea, constipation, dizziness, abdominal pain, skin rash, fevers, chills, night sweats, weight loss, swollen lymph nodes, body aches, joint swelling, chest pain, shortness of breath, mood changes. POSITIVE muscle aches otherwise feels fairly normal  Objective  Blood pressure (!) 128/100, pulse 92, height 6\' 4"  (1.93 m), weight (!) 312 lb (141.5 kg), SpO2 97 %.   General: No apparent distress alert and oriented x3 mood and affect normal, dressed appropriately.  HEENT: Pupils equal, extraocular movements intact  Respiratory: Patient's speak in full sentences and does not appear short of breath  Cardiovascular: No lower extremity edema, non tender, no erythema  Neck exam does have some mild loss of lordosis.  Patient does have some limited sidebending right greater than left.  Patient has increasing significant discomfort with any type of rotation to the right. Mild positive radiation to the shoulder with Spurling's on the right.       Assessment and Plan:        The above documentation has been reviewed and is accurate and complete Lyndal Pulley, DO       Note: This dictation was prepared with Dragon dictation along with smaller phrase technology.  Any transcriptional errors that result from this process are unintentional.

## 2020-01-09 ENCOUNTER — Ambulatory Visit (INDEPENDENT_AMBULATORY_CARE_PROVIDER_SITE_OTHER): Admitting: Family Medicine

## 2020-01-09 ENCOUNTER — Encounter: Payer: Self-pay | Admitting: Family Medicine

## 2020-01-09 ENCOUNTER — Other Ambulatory Visit: Payer: Self-pay

## 2020-01-09 VITALS — BP 128/100 | HR 92 | Ht 76.0 in | Wt 312.0 lb

## 2020-01-09 DIAGNOSIS — M542 Cervicalgia: Secondary | ICD-10-CM | POA: Diagnosis not present

## 2020-01-09 DIAGNOSIS — M62838 Other muscle spasm: Secondary | ICD-10-CM | POA: Diagnosis not present

## 2020-01-09 NOTE — Patient Instructions (Addendum)
Tried light manipulation today MRI cervical [phone] We will be in touch with results Keep doing exercises and gabapentin

## 2020-01-09 NOTE — Assessment & Plan Note (Signed)
Continues to have more of the neck spasm.  Patient states that has been going on for quite some time.  Has not responded at all to the gabapentin or even the manipulation of this time.  Tried some very mild stretching and more of a muscle energy.  Did not respond as well.  We will get an MRI.  Patient is being worked up with multiple other comorbidities and has multiple surgeries scheduled in the near future.  Follow-up again after imaging.

## 2020-01-16 DIAGNOSIS — D1622 Benign neoplasm of long bones of left lower limb: Secondary | ICD-10-CM

## 2020-01-16 HISTORY — DX: Benign neoplasm of long bones of left lower limb: D16.22

## 2020-02-03 ENCOUNTER — Ambulatory Visit
Admission: RE | Admit: 2020-02-03 | Discharge: 2020-02-03 | Disposition: A | Discharge status: Home / Self Care | Source: Ambulatory Visit | Attending: Family Medicine | Admitting: Family Medicine

## 2020-02-03 ENCOUNTER — Other Ambulatory Visit: Payer: Self-pay

## 2020-02-03 DIAGNOSIS — M542 Cervicalgia: Secondary | ICD-10-CM

## 2020-02-05 ENCOUNTER — Other Ambulatory Visit: Payer: Self-pay

## 2020-02-05 ENCOUNTER — Telehealth: Payer: Self-pay | Admitting: Family Medicine

## 2020-02-05 ENCOUNTER — Other Ambulatory Visit: Payer: Self-pay | Admitting: Internal Medicine

## 2020-02-05 DIAGNOSIS — M5010 Cervical disc disorder with radiculopathy, unspecified cervical region: Secondary | ICD-10-CM

## 2020-02-05 NOTE — Telephone Encounter (Signed)
Patient reviewed  Dr. Thompson Caul notes on his recent MRI and would like to proceed with ordering the epidural.

## 2020-02-07 NOTE — Telephone Encounter (Signed)
Epidural ordered.  

## 2020-02-07 NOTE — Telephone Encounter (Signed)
Patient notified via My Chart

## 2020-02-08 ENCOUNTER — Other Ambulatory Visit: Payer: Self-pay | Admitting: Internal Medicine

## 2020-02-14 ENCOUNTER — Ambulatory Visit
Admission: RE | Admit: 2020-02-14 | Discharge: 2020-02-14 | Disposition: A | Discharge status: Home / Self Care | Source: Ambulatory Visit | Attending: Family Medicine | Admitting: Family Medicine

## 2020-02-14 ENCOUNTER — Other Ambulatory Visit: Payer: Self-pay

## 2020-02-14 DIAGNOSIS — M5010 Cervical disc disorder with radiculopathy, unspecified cervical region: Secondary | ICD-10-CM

## 2020-02-14 MED ORDER — IOPAMIDOL (ISOVUE-M 300) INJECTION 61%
1.0000 mL | Freq: Once | INTRAMUSCULAR | Status: AC
  Administered 2020-02-14: 1 mL via EPIDURAL

## 2020-02-14 MED ORDER — TRIAMCINOLONE ACETONIDE 40 MG/ML IJ SUSP (RADIOLOGY)
60.0000 mg | Freq: Once | INTRAMUSCULAR | Status: AC
  Administered 2020-02-14: 60 mg via EPIDURAL

## 2020-02-14 NOTE — Discharge Instructions (Signed)

## 2020-07-17 ENCOUNTER — Encounter (HOSPITAL_BASED_OUTPATIENT_CLINIC_OR_DEPARTMENT_OTHER): Payer: Self-pay

## 2020-07-17 ENCOUNTER — Emergency Department (HOSPITAL_BASED_OUTPATIENT_CLINIC_OR_DEPARTMENT_OTHER)
Admission: EM | Admit: 2020-07-17 | Discharge: 2020-07-17 | Disposition: A | Discharge status: Home / Self Care | Attending: Emergency Medicine | Admitting: Emergency Medicine

## 2020-07-17 ENCOUNTER — Emergency Department (HOSPITAL_BASED_OUTPATIENT_CLINIC_OR_DEPARTMENT_OTHER)

## 2020-07-17 ENCOUNTER — Other Ambulatory Visit: Payer: Self-pay

## 2020-07-17 DIAGNOSIS — R1031 Right lower quadrant pain: Secondary | ICD-10-CM | POA: Diagnosis present

## 2020-07-17 DIAGNOSIS — Z79899 Other long term (current) drug therapy: Secondary | ICD-10-CM | POA: Diagnosis not present

## 2020-07-17 DIAGNOSIS — Z7901 Long term (current) use of anticoagulants: Secondary | ICD-10-CM | POA: Insufficient documentation

## 2020-07-17 DIAGNOSIS — K5792 Diverticulitis of intestine, part unspecified, without perforation or abscess without bleeding: Secondary | ICD-10-CM | POA: Insufficient documentation

## 2020-07-17 DIAGNOSIS — I10 Essential (primary) hypertension: Secondary | ICD-10-CM | POA: Diagnosis not present

## 2020-07-17 LAB — COMPREHENSIVE METABOLIC PANEL
ALT: 16 U/L (ref 0–44)
AST: 14 U/L — ABNORMAL LOW (ref 15–41)
Albumin: 4.1 g/dL (ref 3.5–5.0)
Alkaline Phosphatase: 69 U/L (ref 38–126)
Anion gap: 7 (ref 5–15)
BUN: 12 mg/dL (ref 6–20)
CO2: 30 mmol/L (ref 22–32)
Calcium: 8.8 mg/dL — ABNORMAL LOW (ref 8.9–10.3)
Chloride: 100 mmol/L (ref 98–111)
Creatinine, Ser: 0.7 mg/dL (ref 0.61–1.24)
GFR, Estimated: >60 mL/min (ref >60)
Glucose, Bld: 102 mg/dL — ABNORMAL HIGH (ref 70–99)
Potassium: 4.3 mmol/L (ref 3.5–5.1)
Sodium: 137 mmol/L (ref 135–145)
Total Bilirubin: 0.5 mg/dL (ref 0.3–1.2)
Total Protein: 6.5 g/dL (ref 6.5–8.1)

## 2020-07-17 LAB — CBC WITH DIFFERENTIAL/PLATELET
Abs Immature Granulocytes: 0.05 10*3/uL (ref 0.00–0.07)
Basophils Absolute: 0.1 10*3/uL (ref 0.0–0.1)
Basophils Relative: 1 %
Eosinophils Absolute: 0.1 10*3/uL (ref 0.0–0.5)
Eosinophils Relative: 2 %
HCT: 47.3 % (ref 39.0–52.0)
Hemoglobin: 15.7 g/dL (ref 13.0–17.0)
Immature Granulocytes: 1 %
Lymphocytes Relative: 29 %
Lymphs Abs: 2.3 10*3/uL (ref 0.7–4.0)
MCH: 29.9 pg (ref 26.0–34.0)
MCHC: 33.2 g/dL (ref 30.0–36.0)
MCV: 90.1 fL (ref 80.0–100.0)
Monocytes Absolute: 0.8 10*3/uL (ref 0.1–1.0)
Monocytes Relative: 10 %
Neutro Abs: 4.7 10*3/uL (ref 1.7–7.7)
Neutrophils Relative %: 57 %
Platelets: 169 10*3/uL (ref 150–400)
RBC: 5.25 MIL/uL (ref 4.22–5.81)
RDW: 11.9 % (ref 11.5–15.5)
WBC: 8.1 10*3/uL (ref 4.0–10.5)
nRBC: 0 % (ref 0.0–0.2)

## 2020-07-17 LAB — LIPASE, BLOOD: Lipase: 48 U/L (ref 11–51)

## 2020-07-17 MED ORDER — CIPROFLOXACIN HCL 500 MG PO TABS: 500.0000 mg | ORAL_TABLET | Freq: Two times a day (BID) | ORAL | 0 refills | Status: DC

## 2020-07-17 MED ORDER — METRONIDAZOLE 500 MG PO TABS: 500.0000 mg | ORAL_TABLET | Freq: Two times a day (BID) | ORAL | 0 refills | Status: DC

## 2020-07-17 MED ORDER — SODIUM CHLORIDE 0.9 % IV BOLUS: 1000.0000 mL | Freq: Once | INTRAVENOUS | Status: DC

## 2020-07-17 MED ORDER — METRONIDAZOLE 500 MG PO TABS
500.0000 mg | ORAL_TABLET | Freq: Once | ORAL | Status: AC
  Administered 2020-07-17: 500 mg via ORAL
  Filled 2020-07-17: qty 1

## 2020-07-17 MED ORDER — CIPROFLOXACIN HCL 500 MG PO TABS
500.0000 mg | ORAL_TABLET | Freq: Once | ORAL | Status: AC
  Administered 2020-07-17: 500 mg via ORAL
  Filled 2020-07-17: qty 1

## 2020-07-17 NOTE — ED Triage Notes (Addendum)
Lower abdominal pain, constant nausea vomiting x3, body aches since Saturday.  Patient seen at [facility] and had negative urine and covid test, sent here for CT abdomen.

## 2020-07-17 NOTE — Discharge Instructions (Addendum)
If you develop worsening, continued, or recurrent abdominal pain, uncontrolled vomiting, fever, chest or back pain, or any other new/concerning symptoms then return to the ER for evaluation.  

## 2020-07-17 NOTE — ED Provider Notes (Signed)
[facility] Provider Note   CSN: [id number] Arrival date & time: 07/17/20  1629     History Chief Complaint  Patient presents with  . Abdominal Pain    Dean Knox is a 60 y.o. male.  HPI 60 year old male presents with 3 days of generalized abdominal discomfort as well as nausea and vomiting.  He states the vomiting is gone over the last day or 2 but he still has some nausea.  He just feels generally not well but more fatigued anything else.  His pain is in his left lower abdomen and some bloating sensation in his right upper quadrant.  No fevers though he did feel achy all over.  He has had a couple loose stools but no high-volume diarrhea.  Pain is mild at this point, rated as about a 3 out of 10.  He went to urgent care where he was given Zofran without much help and sent here for CT.  Past Medical History:  Diagnosis Date  . Hypertension     Patient Active Problem List   Diagnosis Date Noted  . Neck muscle spasm 12/01/2019  . Nonallopathic lesion of cervical region 12/01/2019  . Nonallopathic lesion of thoracic region 12/01/2019    History reviewed. No pertinent surgical history.     Family History  Problem Relation Age of Onset  . Healthy Mother   . Healthy Father     Social History   Tobacco Use  . Smoking status: Never Smoker  . Smokeless tobacco: Never Used  Substance Use Topics  . Alcohol use: Yes    Comment: occ    Home Medications Prior to Admission medications   Medication Sig Start Date End Date Taking? Authorizing Provider  ciprofloxacin (CIPRO) 500 MG tablet Take 1 tablet (500 mg total) by mouth 2 (two) times daily. 07/17/20  Yes Sherwood Gambler, MD  metroNIDAZOLE (FLAGYL) 500 MG tablet Take 1 tablet (500 mg total) by mouth 2 (two) times daily. 07/17/20  Yes Sherwood Gambler, MD  butalbital-aspirin-caffeine [facility]) [phone] MG capsule Take 2 capsules by mouth 2 (two) times daily as needed for headache (TAKE AT  ONSET).    [provider]  citalopram (CELEXA) 40 MG tablet Take 40 mg by mouth at bedtime. 10/07/15   [provider]  DHS TAR 0.5 % shampoo once a week. 10/11/15   [provider]  fexofenadine (ALLEGRA) 180 MG tablet Take 1 tablet (180 mg total) by mouth daily. 01/05/19   Wieters, Hallie C, PA-C  fluticasone (FLONASE) 50 MCG/ACT nasal spray Place 1-2 sprays into both nostrils daily for 7 days. 01/05/19 01/12/19  Wieters, Hallie C, PA-C  gabapentin (NEURONTIN) 100 MG capsule Take 2 capsules (200 mg total) by mouth at bedtime. 12/06/19   Lyndal Pulley, DO  hydrOXYzine (ATARAX/VISTARIL) 25 MG tablet Take 1 tablet (25 mg total) by mouth every 6 (six) hours. 02/10/19   Bast, Tressia Miners A, NP  lisinopril (PRINIVIL,ZESTRIL) 20 MG tablet Take 20 mg by mouth at bedtime. 09/26/15   [provider]  methylPREDNISolone (MEDROL DOSEPAK) 4 MG TBPK tablet Take as directed, begin 11/6 in morning 01/05/19   Wieters, Hallie C, PA-C  olopatadine (PATANOL) 0.1 % ophthalmic solution Place 1 drop into both eyes 2 (two) times daily. 01/05/19   Wieters, Hallie C, PA-C  predniSONE (DELTASONE) 20 MG tablet Take 2 tablets (40 mg total) by mouth daily with breakfast. 12/01/19   Lyndal Pulley, DO  promethazine (PHENERGAN) 25 MG tablet Take 25 mg  by mouth 2 (two) times daily as needed (for headache-induced nausea).  10/11/15   [provider]  traZODone (DESYREL) 100 MG tablet Take 100 mg by mouth at bedtime. 09/26/15   [provider]  triamcinolone cream (KENALOG) 0.1 % Apply 1 application topically 2 (two) times daily. 02/10/19   Bast, Traci A, NP  XARELTO 20 MG TABS tablet Take 20 mg by mouth every morning. 11/13/15   [provider]    Allergies    Penicillins  Review of Systems   Review of Systems  Constitutional: Negative for fever.  Gastrointestinal: Positive for abdominal pain, diarrhea, nausea and vomiting.  All other systems reviewed and are  negative.   Physical Exam Updated Vital Signs BP (!) 165/107   Pulse 72   Temp 98.4 F (36.9 C) (Oral)   Resp 18   Ht 6\' 4"  (1.93 m)   Wt 130.6 kg   SpO2 100%   BMI 35.06 kg/m   Physical Exam Vitals and nursing note reviewed.  Constitutional:      General: He is not in acute distress.    Appearance: He is well-developed. He is not ill-appearing or diaphoretic.  HENT:     Head: Normocephalic and atraumatic.     Right Ear: External ear normal.     Left Ear: External ear normal.     Nose: Nose normal.  Eyes:     General:        Right eye: No discharge.        Left eye: No discharge.  Cardiovascular:     Rate and Rhythm: Normal rate and regular rhythm.     Heart sounds: Normal heart sounds.  Pulmonary:     Effort: Pulmonary effort is normal.     Breath sounds: Normal breath sounds.  Abdominal:     Palpations: Abdomen is soft.     Tenderness: There is abdominal tenderness (LLQ>RLQ tenderness) in the right lower quadrant and left lower quadrant. There is no guarding.  Musculoskeletal:     Cervical back: Neck supple.  Skin:    General: Skin is warm and dry.  Neurological:     Mental Status: He is alert.  Psychiatric:        Mood and Affect: Mood is not anxious.     ED Results / Procedures / Treatments   Labs (all labs ordered are listed, but only abnormal results are displayed) Labs Reviewed  COMPREHENSIVE METABOLIC PANEL - Abnormal; Notable for the following components:      Result Value   Glucose, Bld 102 (*)    Calcium 8.8 (*)    AST 14 (*)    All other components within normal limits  LIPASE, BLOOD  CBC WITH DIFFERENTIAL/PLATELET    EKG None  Radiology CT ABDOMEN PELVIS WO CONTRAST  Result Date: 07/17/2020 CLINICAL DATA:  Lower abdominal pain. EXAM: CT ABDOMEN AND PELVIS WITHOUT CONTRAST TECHNIQUE: Multidetector CT imaging of the abdomen and pelvis was performed following the standard protocol without IV contrast. COMPARISON:  None. FINDINGS: Lower  chest: A 3 mm calcified granuloma is seen within the right lung base. Hepatobiliary: No focal liver abnormality is seen. No gallstones, gallbladder wall thickening, or biliary dilatation. Pancreas: Unremarkable. No pancreatic ductal dilatation or surrounding inflammatory changes. Spleen: Normal in size without focal abnormality. Adrenals/Urinary Tract: Adrenal glands are unremarkable. Kidneys are normal, without renal calculi, focal lesion, or hydronephrosis. Bladder is unremarkable. Stomach/Bowel: Stomach is within normal limits. The appendix is not clearly identified. No evidence of  bowel dilatation. Moderate to markedly inflamed diverticula are seen within the proximal sigmoid colon. There is no evidence of associated perforation or abscess. Vascular/Lymphatic: Aortic atherosclerosis. No enlarged abdominal or pelvic lymph nodes. Reproductive: Prostate is unremarkable. Other: No abdominal wall hernia or abnormality. No abdominopelvic ascites. Musculoskeletal: No acute or significant osseous findings. IMPRESSION: Moderate to marked severity sigmoid diverticulitis. Electronically Signed   By: Virgina Norfolk M.D.   On: 07/17/2020 18:17    Procedures Procedures   Medications Ordered in ED Medications  sodium chloride 0.9 % bolus 1,000 mL (has no administration in time range)  ciprofloxacin (CIPRO) tablet 500 mg (has no administration in time range)  metroNIDAZOLE (FLAGYL) tablet 500 mg (has no administration in time range)    ED Course  I have reviewed the triage vital signs and the nursing notes.  Pertinent labs & imaging results that were available during my care of the patient were reviewed by me and considered in my medical decision making (see chart for details).    MDM Rules/Calculators/A&P                          Patient CT scan does show diverticulitis.  No obvious complications.  Labs are unremarkable.  He feels well and has minimal pain.  No further vomiting.  Given this I think he  is a reasonable patient to do outpatient.  Given his penicillin allergy we will do Cipro/Flagyl.  We discussed no drinking alcohol during this time.  Otherwise, we discussed return precautions but he appears stable for outpatient management and PCP/GI follow-up. Final Clinical Impression(s) / ED Diagnoses Final diagnoses:  Acute diverticulitis    Rx / DC Orders ED Discharge Orders         Ordered    ciprofloxacin (CIPRO) 500 MG tablet  2 times daily        07/17/20 1851    metroNIDAZOLE (FLAGYL) 500 MG tablet  2 times daily        07/17/20 1851           Sherwood Gambler, MD 07/17/20 1856

## 2021-03-12 ENCOUNTER — Encounter (HOSPITAL_COMMUNITY): Payer: Self-pay | Admitting: Emergency Medicine

## 2021-03-12 ENCOUNTER — Other Ambulatory Visit: Payer: Self-pay

## 2021-03-12 ENCOUNTER — Ambulatory Visit

## 2021-03-12 ENCOUNTER — Ambulatory Visit (HOSPITAL_COMMUNITY)
Admission: EM | Admit: 2021-03-12 | Discharge: 2021-03-12 | Disposition: A | Discharge status: Home / Self Care | Attending: Family Medicine | Admitting: Family Medicine

## 2021-03-12 DIAGNOSIS — J302 Other seasonal allergic rhinitis: Secondary | ICD-10-CM

## 2021-03-12 DIAGNOSIS — I10 Essential (primary) hypertension: Secondary | ICD-10-CM | POA: Diagnosis not present

## 2021-03-12 MED ORDER — FLUTICASONE PROPIONATE 50 MCG/ACT NA SUSP: 1.0000 | Freq: Every day | NASAL | 0 refills | Status: DC

## 2021-03-12 MED ORDER — METHYLPREDNISOLONE SODIUM SUCC 125 MG IJ SOLR
INTRAMUSCULAR | Status: AC
  Filled 2021-03-12: qty 2

## 2021-03-12 MED ORDER — METHYLPREDNISOLONE SODIUM SUCC 125 MG IJ SOLR
125.0000 mg | Freq: Once | INTRAMUSCULAR | Status: AC
  Administered 2021-03-12: 125 mg via INTRAMUSCULAR

## 2021-03-12 NOTE — Discharge Instructions (Signed)
You were seen today for seasonal allergies.  You have been given a dose of solu-medrol as you have had this in the past and done well.  I have also refilled your flonase.  If you continue with symptoms please follow up with your primary care provider for further care/treatment.

## 2021-03-12 NOTE — ED Triage Notes (Signed)
PT reports watery eyes, congestion, sneezing for 2 days.  Has history of seasonal allergies, states he typically gets a shot of solu medrol which clears symptoms.

## 2021-03-12 NOTE — ED Provider Notes (Signed)
Dean Knox    CSN: [id number] Arrival date & time: 03/12/21  0802      History   Chief Complaint Chief Complaint  Patient presents with   Nasal Congestion    HPI Dean Knox is a 61 y.o. male.   Patient is here for seasonal allergies.  He has itchy eyes, runny nose, sneezing.  He usually get this every year.  He takes Conservation officer, historic buildings, flonase without much help.  He usually comes here and gets a solumedrol shot which helps.  No fevers/chills.  No cough.  His bp is elevated today, he did not take his bp medication yesterday or today.    Past Medical History:  Diagnosis Date   Hypertension     Patient Active Problem List   Diagnosis Date Noted   Neck muscle spasm 12/01/2019   Nonallopathic lesion of cervical region 12/01/2019   Nonallopathic lesion of thoracic region 12/01/2019    History reviewed. No pertinent surgical history.     Home Medications    Prior to Admission medications   Medication Sig Start Date End Date Taking? Authorizing Provider  butalbital-aspirin-caffeine [facility]) 50-325-40 MG capsule Take 2 capsules by mouth 2 (two) times daily as needed for headache (TAKE AT ONSET).    [provider]  ciprofloxacin (CIPRO) 500 MG tablet Take 1 tablet (500 mg total) by mouth 2 (two) times daily. 07/17/20   Sherwood Gambler, MD  citalopram (CELEXA) 40 MG tablet Take 40 mg by mouth at bedtime. 10/07/15   [provider]  DHS TAR 0.5 % shampoo once a week. 10/11/15   [provider]  fexofenadine (ALLEGRA) 180 MG tablet Take 1 tablet (180 mg total) by mouth daily. 01/05/19   Wieters, Hallie C, PA-C  fluticasone (FLONASE) 50 MCG/ACT nasal spray Place 1-2 sprays into both nostrils daily for 7 days. 01/05/19 01/12/19  Wieters, Hallie C, PA-C  gabapentin (NEURONTIN) 100 MG capsule Take 2 capsules (200 mg total) by mouth at bedtime. 12/06/19   Lyndal Pulley, DO  hydrOXYzine (ATARAX/VISTARIL) 25 MG tablet Take 1 tablet (25 mg  total) by mouth every 6 (six) hours. 02/10/19   Bast, Tressia Miners A, NP  lisinopril (PRINIVIL,ZESTRIL) 20 MG tablet Take 20 mg by mouth at bedtime. 09/26/15   [provider]  methylPREDNISolone (MEDROL DOSEPAK) 4 MG TBPK tablet Take as directed, begin 11/6 in morning 01/05/19   Wieters, Hallie C, PA-C  metroNIDAZOLE (FLAGYL) 500 MG tablet Take 1 tablet (500 mg total) by mouth 2 (two) times daily. 07/17/20   Sherwood Gambler, MD  olopatadine (PATANOL) 0.1 % ophthalmic solution Place 1 drop into both eyes 2 (two) times daily. 01/05/19   Wieters, Hallie C, PA-C  predniSONE (DELTASONE) 20 MG tablet Take 2 tablets (40 mg total) by mouth daily with breakfast. 12/01/19   Lyndal Pulley, DO  promethazine (PHENERGAN) 25 MG tablet Take 25 mg by mouth 2 (two) times daily as needed (for headache-induced nausea).  10/11/15   [provider]  traZODone (DESYREL) 100 MG tablet Take 100 mg by mouth at bedtime. 09/26/15   [provider]  triamcinolone cream (KENALOG) 0.1 % Apply 1 application topically 2 (two) times daily. 02/10/19   Bast, Traci A, NP  XARELTO 20 MG TABS tablet Take 20 mg by mouth every morning. 11/13/15   [provider]    Family History Family History  Problem Relation Age of Onset   Healthy Mother    Healthy Father     Social  History Social History   Tobacco Use   Smoking status: Never   Smokeless tobacco: Never  Substance Use Topics   Alcohol use: Yes    Comment: occ     Allergies   Penicillins   Review of Systems Review of Systems  Constitutional:  Negative for chills and fatigue.  HENT:  Positive for congestion, postnasal drip and rhinorrhea. Negative for sinus pressure, sinus pain and sore throat.   Eyes:  Positive for discharge, redness and itching.  Respiratory: Negative.    Cardiovascular: Negative.   Gastrointestinal: Negative.   Genitourinary: Negative.     Physical Exam Triage Vital Signs ED Triage Vitals  Enc Vitals Group      BP 03/12/21 0814 (!) 188/115     Pulse Rate 03/12/21 0814 79     Resp 03/12/21 0814 16     Temp 03/12/21 0814 98.5 F (36.9 C)     Temp src --      SpO2 03/12/21 0814 96 %     Weight --      Height --      Head Circumference --      Peak Flow --      Pain Score 03/12/21 0813 0     Pain Loc --      Pain Edu? --      Excl. in [address]? --    No data found.  Updated Vital Signs BP (!) 188/115 Comment: didn't take BP meds for 2 days, has used OTC cough and cold   Pulse 79    Temp 98.5 F (36.9 C)    Resp 16    SpO2 96%   Visual Acuity Right Eye Distance:   Left Eye Distance:   Bilateral Distance:    Right Eye Near:   Left Eye Near:    Bilateral Near:     Physical Exam Constitutional:      Appearance: Normal appearance.  HENT:     Head: Normocephalic and atraumatic.     Right Ear: Tympanic membrane normal.     Left Ear: Tympanic membrane normal.     Nose: Congestion and rhinorrhea present.     Mouth/Throat:     Mouth: Mucous membranes are moist.     Pharynx: Posterior oropharyngeal erythema present. No oropharyngeal exudate.  Eyes:     General:        Right eye: Discharge present.        Left eye: Discharge present.    Conjunctiva/sclera:     Right eye: Right conjunctiva is injected.     Left eye: Left conjunctiva is injected.  Cardiovascular:     Rate and Rhythm: Normal rate and regular rhythm.     Pulses: Normal pulses.     Heart sounds: Normal heart sounds.  Pulmonary:     Effort: Pulmonary effort is normal.     Breath sounds: Normal breath sounds.  Musculoskeletal:     Cervical back: Normal range of motion and neck supple.  Neurological:     Mental Status: He is alert.     UC Treatments / Results  Labs (all labs ordered are listed, but only abnormal results are displayed) Labs Reviewed - No data to display  EKG   Radiology No results found.  Procedures Procedures (including critical care time)  Medications Ordered in UC Medications   methylPREDNISolone sodium succinate (SOLU-MEDROL) 125 mg/2 mL injection 125 mg (125 mg Intramuscular Given 03/12/21 0837)    Initial Impression / Assessment and Plan / UC  Course  I have reviewed the triage vital signs and the nursing notes.  Pertinent labs & imaging results that were available during my care of the patient were reviewed by me and considered in my medical decision making (see chart for details).  Patient seen for severe allergies.  He has requested solu-medrol as this has been given in the past and he did well.  This was given to him today.  Advised to continue flonase/allegra as well.  Patient also advised to take his bp medication when he gets home as his bp is elevated today.  He is aware and agrees.    Final Clinical Impressions(s) / UC Diagnoses   Final diagnoses:  Seasonal allergic rhinitis, unspecified trigger  Essential hypertension     Discharge Instructions      You were seen today for seasonal allergies.  You have been given a dose of solu-medrol as you have had this in the past and done well.  I have also refilled your flonase.  If you continue with symptoms please follow up with your primary care provider for further care/treatment.     ED Prescriptions     Medication Sig Dispense Auth. Provider   fluticasone (FLONASE) 50 MCG/ACT nasal spray Place 1-2 sprays into both nostrils daily for 7 days. 1 g Rondel Oh, MD      PDMP not reviewed this encounter.   Rondel Oh, MD 03/12/21 [phone]

## 2021-03-26 ENCOUNTER — Encounter (HOSPITAL_BASED_OUTPATIENT_CLINIC_OR_DEPARTMENT_OTHER): Payer: Self-pay | Admitting: Nurse Practitioner

## 2021-03-26 ENCOUNTER — Ambulatory Visit (INDEPENDENT_AMBULATORY_CARE_PROVIDER_SITE_OTHER): Admitting: Nurse Practitioner

## 2021-03-26 ENCOUNTER — Other Ambulatory Visit: Payer: Self-pay

## 2021-03-26 ENCOUNTER — Other Ambulatory Visit (HOSPITAL_BASED_OUTPATIENT_CLINIC_OR_DEPARTMENT_OTHER): Payer: Self-pay

## 2021-03-26 VITALS — BP 140/110 | HR 87 | Ht 76.0 in | Wt 298.4 lb

## 2021-03-26 DIAGNOSIS — I872 Venous insufficiency (chronic) (peripheral): Secondary | ICD-10-CM

## 2021-03-26 DIAGNOSIS — M7711 Lateral epicondylitis, right elbow: Secondary | ICD-10-CM | POA: Insufficient documentation

## 2021-03-26 DIAGNOSIS — G4733 Obstructive sleep apnea (adult) (pediatric): Secondary | ICD-10-CM

## 2021-03-26 DIAGNOSIS — Z86006 Personal history of melanoma in-situ: Secondary | ICD-10-CM | POA: Insufficient documentation

## 2021-03-26 DIAGNOSIS — L400 Psoriasis vulgaris: Secondary | ICD-10-CM | POA: Insufficient documentation

## 2021-03-26 DIAGNOSIS — G43909 Migraine, unspecified, not intractable, without status migrainosus: Secondary | ICD-10-CM | POA: Insufficient documentation

## 2021-03-26 DIAGNOSIS — I10 Essential (primary) hypertension: Secondary | ICD-10-CM | POA: Diagnosis not present

## 2021-03-26 DIAGNOSIS — F5104 Psychophysiologic insomnia: Secondary | ICD-10-CM

## 2021-03-26 DIAGNOSIS — R609 Edema, unspecified: Secondary | ICD-10-CM | POA: Insufficient documentation

## 2021-03-26 DIAGNOSIS — G4700 Insomnia, unspecified: Secondary | ICD-10-CM | POA: Insufficient documentation

## 2021-03-26 DIAGNOSIS — Z8739 Personal history of other diseases of the musculoskeletal system and connective tissue: Secondary | ICD-10-CM | POA: Insufficient documentation

## 2021-03-26 DIAGNOSIS — N529 Male erectile dysfunction, unspecified: Secondary | ICD-10-CM

## 2021-03-26 DIAGNOSIS — Z86718 Personal history of other venous thrombosis and embolism: Secondary | ICD-10-CM

## 2021-03-26 DIAGNOSIS — F4001 Agoraphobia with panic disorder: Secondary | ICD-10-CM

## 2021-03-26 DIAGNOSIS — G4710 Hypersomnia, unspecified: Secondary | ICD-10-CM | POA: Insufficient documentation

## 2021-03-26 DIAGNOSIS — Z6836 Body mass index (BMI) 36.0-36.9, adult: Secondary | ICD-10-CM

## 2021-03-26 DIAGNOSIS — K5732 Diverticulitis of large intestine without perforation or abscess without bleeding: Secondary | ICD-10-CM | POA: Insufficient documentation

## 2021-03-26 DIAGNOSIS — F410 Panic disorder [episodic paroxysmal anxiety] without agoraphobia: Secondary | ICD-10-CM

## 2021-03-26 DIAGNOSIS — E7800 Pure hypercholesterolemia, unspecified: Secondary | ICD-10-CM

## 2021-03-26 DIAGNOSIS — F32A Depression, unspecified: Secondary | ICD-10-CM | POA: Insufficient documentation

## 2021-03-26 DIAGNOSIS — E1159 Type 2 diabetes mellitus with other circulatory complications: Secondary | ICD-10-CM

## 2021-03-26 DIAGNOSIS — D126 Benign neoplasm of colon, unspecified: Secondary | ICD-10-CM | POA: Insufficient documentation

## 2021-03-26 DIAGNOSIS — K429 Umbilical hernia without obstruction or gangrene: Secondary | ICD-10-CM | POA: Insufficient documentation

## 2021-03-26 DIAGNOSIS — F4310 Post-traumatic stress disorder, unspecified: Secondary | ICD-10-CM

## 2021-03-26 DIAGNOSIS — Z85820 Personal history of malignant melanoma of skin: Secondary | ICD-10-CM | POA: Insufficient documentation

## 2021-03-26 DIAGNOSIS — L821 Other seborrheic keratosis: Secondary | ICD-10-CM | POA: Insufficient documentation

## 2021-03-26 DIAGNOSIS — H1045 Other chronic allergic conjunctivitis: Secondary | ICD-10-CM

## 2021-03-26 DIAGNOSIS — F900 Attention-deficit hyperactivity disorder, predominantly inattentive type: Secondary | ICD-10-CM

## 2021-03-26 DIAGNOSIS — L570 Actinic keratosis: Secondary | ICD-10-CM | POA: Insufficient documentation

## 2021-03-26 DIAGNOSIS — F909 Attention-deficit hyperactivity disorder, unspecified type: Secondary | ICD-10-CM | POA: Insufficient documentation

## 2021-03-26 DIAGNOSIS — E785 Hyperlipidemia, unspecified: Secondary | ICD-10-CM | POA: Insufficient documentation

## 2021-03-26 DIAGNOSIS — F4312 Post-traumatic stress disorder, chronic: Secondary | ICD-10-CM

## 2021-03-26 DIAGNOSIS — I509 Heart failure, unspecified: Secondary | ICD-10-CM

## 2021-03-26 DIAGNOSIS — H02822 Cysts of right lower eyelid: Secondary | ICD-10-CM

## 2021-03-26 DIAGNOSIS — M26609 Unspecified temporomandibular joint disorder, unspecified side: Secondary | ICD-10-CM

## 2021-03-26 DIAGNOSIS — F332 Major depressive disorder, recurrent severe without psychotic features: Secondary | ICD-10-CM

## 2021-03-26 DIAGNOSIS — K760 Fatty (change of) liver, not elsewhere classified: Secondary | ICD-10-CM

## 2021-03-26 DIAGNOSIS — Z8679 Personal history of other diseases of the circulatory system: Secondary | ICD-10-CM | POA: Insufficient documentation

## 2021-03-26 DIAGNOSIS — Z9989 Dependence on other enabling machines and devices: Secondary | ICD-10-CM

## 2021-03-26 DIAGNOSIS — F339 Major depressive disorder, recurrent, unspecified: Secondary | ICD-10-CM | POA: Insufficient documentation

## 2021-03-26 DIAGNOSIS — G4730 Sleep apnea, unspecified: Secondary | ICD-10-CM

## 2021-03-26 DIAGNOSIS — G2581 Restless legs syndrome: Secondary | ICD-10-CM | POA: Insufficient documentation

## 2021-03-26 DIAGNOSIS — L219 Seborrheic dermatitis, unspecified: Secondary | ICD-10-CM | POA: Insufficient documentation

## 2021-03-26 DIAGNOSIS — J301 Allergic rhinitis due to pollen: Secondary | ICD-10-CM | POA: Insufficient documentation

## 2021-03-26 DIAGNOSIS — M1610 Unilateral primary osteoarthritis, unspecified hip: Secondary | ICD-10-CM | POA: Insufficient documentation

## 2021-03-26 DIAGNOSIS — I749 Embolism and thrombosis of unspecified artery: Secondary | ICD-10-CM | POA: Insufficient documentation

## 2021-03-26 DIAGNOSIS — E119 Type 2 diabetes mellitus without complications: Secondary | ICD-10-CM | POA: Insufficient documentation

## 2021-03-26 DIAGNOSIS — R7309 Other abnormal glucose: Secondary | ICD-10-CM

## 2021-03-26 DIAGNOSIS — Z139 Encounter for screening, unspecified: Secondary | ICD-10-CM

## 2021-03-26 DIAGNOSIS — H169 Unspecified keratitis: Secondary | ICD-10-CM

## 2021-03-26 HISTORY — DX: Unspecified temporomandibular joint disorder, unspecified side: M26.609

## 2021-03-26 HISTORY — DX: Dependence on other enabling machines and devices: Z99.89

## 2021-03-26 HISTORY — DX: Other abnormal glucose: R73.09

## 2021-03-26 HISTORY — DX: Edema, unspecified: R60.9

## 2021-03-26 HISTORY — DX: Unspecified keratitis: H16.9

## 2021-03-26 HISTORY — DX: Lateral epicondylitis, right elbow: M77.11

## 2021-03-26 HISTORY — DX: Embolism and thrombosis of unspecified artery: I74.9

## 2021-03-26 HISTORY — DX: Other chronic allergic conjunctivitis: H10.45

## 2021-03-26 HISTORY — DX: Cysts of right lower eyelid: H02.822

## 2021-03-26 HISTORY — DX: Hypersomnia, unspecified: G47.30

## 2021-03-26 LAB — CBC WITH DIFFERENTIAL/PLATELET
Basophils Absolute: 0.1 10*3/uL (ref 0.0–0.2)
Basos: 1 %
EOS (ABSOLUTE): 0.1 10*3/uL (ref 0.0–0.4)
Eos: 2 %
Hematocrit: 54.9 % — ABNORMAL HIGH (ref 37.5–51.0)
Hemoglobin: 18.9 g/dL — ABNORMAL HIGH (ref 13.0–17.7)
Immature Grans (Abs): 0.1 10*3/uL (ref 0.0–0.1)
Immature Granulocytes: 1 %
Lymphocytes Absolute: 2.7 10*3/uL (ref 0.7–3.1)
Lymphs: 30 %
MCH: 30.6 pg (ref 26.6–33.0)
MCHC: 34.4 g/dL (ref 31.5–35.7)
MCV: 89 fL (ref 79–97)
Monocytes Absolute: 0.7 10*3/uL (ref 0.1–0.9)
Monocytes: 8 %
Neutrophils Absolute: 5.3 10*3/uL (ref 1.4–7.0)
Neutrophils: 58 %
Platelets: 204 10*3/uL (ref 150–450)
RBC: 6.18 x10E6/uL — ABNORMAL HIGH (ref 4.14–5.80)
RDW: 11.5 % — ABNORMAL LOW (ref 11.6–15.4)
WBC: 9 10*3/uL (ref 3.4–10.8)

## 2021-03-26 LAB — COMPREHENSIVE METABOLIC PANEL
ALT: 46 IU/L — ABNORMAL HIGH (ref 0–44)
AST: 30 IU/L (ref 0–40)
Albumin/Globulin Ratio: 2.1 (ref 1.2–2.2)
Albumin: 4.9 g/dL (ref 3.8–4.9)
Alkaline Phosphatase: 89 IU/L (ref 44–121)
BUN/Creatinine Ratio: 17 (ref 9–20)
BUN: 17 mg/dL (ref 6–24)
Bilirubin Total: 0.5 mg/dL (ref 0.0–1.2)
CO2: 25 mmol/L (ref 20–29)
Calcium: 9.7 mg/dL (ref 8.7–10.2)
Chloride: 102 mmol/L (ref 96–106)
Creatinine, Ser: 1.01 mg/dL (ref 0.76–1.27)
Globulin, Total: 2.3 g/dL (ref 1.5–4.5)
Glucose: 161 mg/dL — ABNORMAL HIGH (ref 70–99)
Potassium: 4.5 mmol/L (ref 3.5–5.2)
Sodium: 142 mmol/L (ref 134–144)
Total Protein: 7.2 g/dL (ref 6.0–8.5)
eGFR: 86 mL/min/{1.73_m2} (ref >59)

## 2021-03-26 LAB — HEMOGLOBIN A1C
Est. average glucose Bld gHb Est-mCnc: 148 mg/dL
Hgb A1c MFr Bld: 6.8 % — ABNORMAL HIGH (ref 4.8–5.6)

## 2021-03-26 LAB — LIPID PANEL
Chol/HDL Ratio: 4.6 ratio (ref 0.0–5.0)
Cholesterol, Total: 286 mg/dL — ABNORMAL HIGH (ref 100–199)
HDL: 62 mg/dL (ref >39)
LDL Chol Calc (NIH): 195 mg/dL — ABNORMAL HIGH (ref 0–99)
Triglycerides: 159 mg/dL — ABNORMAL HIGH (ref 0–149)
VLDL Cholesterol Cal: 29 mg/dL (ref 5–40)

## 2021-03-26 MED ORDER — TADALAFIL 5 MG PO TABS: 5.0000 mg | ORAL_TABLET | Freq: Every day | ORAL | 0 refills | Status: DC | PRN

## 2021-03-26 MED ORDER — LOSARTAN POTASSIUM 100 MG PO TABS: 100.0000 mg | ORAL_TABLET | Freq: Every day | ORAL | 3 refills | Status: DC

## 2021-03-26 MED ORDER — SERTRALINE HCL 50 MG PO TABS
50.0000 mg | ORAL_TABLET | Freq: Every day | ORAL | 3 refills | Status: DC
  Filled 2021-03-26: qty 30, 30d supply, fill #0

## 2021-03-26 MED ORDER — ALPRAZOLAM 0.5 MG PO TABS
0.5000 mg | ORAL_TABLET | Freq: Two times a day (BID) | ORAL | 1 refills | Status: DC | PRN
  Filled 2021-03-26: qty 45, 23d supply, fill #0

## 2021-03-26 MED ORDER — ALPRAZOLAM 0.5 MG PO TABS: 0.5000 mg | ORAL_TABLET | Freq: Two times a day (BID) | ORAL | 1 refills | Status: DC | PRN

## 2021-03-26 MED ORDER — ZOLPIDEM TARTRATE 5 MG PO TABS: 5.0000 mg | ORAL_TABLET | Freq: Every evening | ORAL | 1 refills | Status: DC | PRN

## 2021-03-26 MED ORDER — SERTRALINE HCL 50 MG PO TABS: 50.0000 mg | ORAL_TABLET | Freq: Every day | ORAL | 3 refills | Status: DC

## 2021-03-26 MED ORDER — ZOLPIDEM TARTRATE 5 MG PO TABS
5.0000 mg | ORAL_TABLET | Freq: Every evening | ORAL | 1 refills | Status: DC | PRN
  Filled 2021-03-26: qty 30, 30d supply, fill #0

## 2021-03-26 MED ORDER — LOSARTAN POTASSIUM 100 MG PO TABS
100.0000 mg | ORAL_TABLET | Freq: Every day | ORAL | 3 refills | Status: DC
  Filled 2021-03-26: qty 90, 90d supply, fill #0

## 2021-03-26 NOTE — Assessment & Plan Note (Signed)
Major depressive disorder with acute exacerbation at this time. Patient did stop taking his medication last summer and has progressively experienced worsening symptoms. He did have an acute event occur over the weekend that has caused an exacerbation even further. Determined patient was experienced side effects from the medication including sexual dysfunction and is hesitant to restart citalopram at this time.  Discussed with patient the side effects of any SSRI/SNRI medications and recommend an alternative medication to see if symptoms are less severe.  He is willing to do this. At this time we will start sertraline 50 mg at bedtime and include as needed Xanax 0.5 mg up to twice a day for panic symptoms.  He may continue to take trazodone at bedtime.  We will also include Ambien for short-term at bedtime to help with sleep schedule. There are no alarm symptoms present today.  No SI/HI.  He was able to speak with a psychologist to the office today and has an appointment set up to be seen in the next 24 hours.  He will follow-up with psychiatry next week. Will defer further medication management to psychiatry unless input by primary care as needed. Patient is aware to seek medical attention immediately if his symptoms worsen or if he begins to have suicidal thoughts or behaviors. We will plan to follow-up in the next few weeks when CPE.  Encourage patient to keep appointments with psychology and psychiatry for evaluation and further management.

## 2021-03-26 NOTE — Assessment & Plan Note (Signed)
Longstanding history of OSA with intermittent use of CPAP. Patient does have uncontrolled hypertension today but is also been off of his blood pressure medication for a while now. Discussed with the patient the importance of using CPAP to help control blood pressures and reduce risks associated with uncontrolled sleep apnea. Will consider referral for neurology for further evaluation if patient is willing. Will review previous medical records for further evaluation.

## 2021-03-26 NOTE — Assessment & Plan Note (Signed)
Exacerbation of anxiety disorder with significant impairment on sleep. At this time feel that lack of sleep is likely exacerbating his anxiety and depression symptoms and may continue to contribute to decline.  Discussed with patient medication options that may be trialed for short-term.  To see if we can get sleep schedule under better control.  He has tolerated Ambien in the past.  We will try this again at 5 mg.  He does have an appointment with mental health set up next week therefore he can further discuss the recommendations with them and determine the most appropriate long-term medication management. Did discuss with patient the importance of not taking Xanax and Ambien at the same time.  He expresses understanding. Recommend follow-up if symptoms worsen or fail to improve once medication started.

## 2021-03-26 NOTE — Assessment & Plan Note (Signed)
BMI 36.32 today Recent increase in weight reported by patient. Recommendations for diet and activity provided on handout today. Will obtain labs to determine factors that could be contributing to weight gain. Patient may benefit from GLP-1 if hemoglobin A1c is found to be elevated. We will plan to follow-up at CPE in next few weeks and discuss further.

## 2021-03-26 NOTE — Patient Instructions (Addendum)
Thank you for choosing [place] [facility] for your Primary Care needs. I am excited for the opportunity to partner with you to meet your health care goals. It was a pleasure meeting you today!  Recommendations from today's visit: We will work on getting you in with Dr. Michail Sermon as soon as possible so you have someone to chat with. We will plan on doing your physical in the next couple of weeks and can go over your labs at that time. We can also see how you are feeling on your medication. If you want to wait until after your appointment with mental health we can.   Information on diet, exercise, and health maintenance recommendations are listed below. This is information to help you be sure you are on track for optimal health and monitoring.   Please look over this and let us know if you have any questions or if you have completed any of the health maintenance outside of [place] so that we can be sure your records are up to date.  ___________________________________________________________ About Me: I am an Adult-Geriatric Nurse Practitioner with a background in caring for patients for more than 20 years with a strong intensive care background. I provide primary care and sports medicine services to patients age 59 and older within this office. My education had a strong focus on caring for the older adult population, which I am passionate about. I am also the director of the APP Fellowship with [facility].   My desire is to provide you with the best service through preventive medicine and supportive care. I consider you a part of the medical team and value your input. I work diligently to ensure that you are heard and your needs are met in a safe and effective manner. I want you to feel comfortable with me as your provider and want you to know that your health concerns are important to me.  For your information, our office hours are: Monday, Tuesday, and Thursday 8:00  AM - 5:00 PM Wednesday and Friday 8:00 AM - 12:00 PM.   In my time away from the office I am teaching new APP's within the system and am unavailable, but my partner, Dr. Burnard Bunting is in the office for emergent needs.   If you have questions or concerns, please call our office at [phone] or send [place] a MyChart message and we will respond as quickly as possible.  ____________________________________________________________ MyChart:  For all urgent or time sensitive needs we ask that you please call the office to avoid delays. Our number is [phone]. MyChart is not constantly monitored and due to the large volume of messages a day, replies may take up to 72 business hours.  MyChart Policy: MyChart allows for you to see your visit notes, after visit summary, provider recommendations, lab and tests results, make an appointment, request refills, and contact your provider or the office for non-urgent questions or concerns. Providers are seeing patients during normal business hours and do not have built in time to review MyChart messages.  We ask that you allow a minimum of 3 business days for responses to [employer]. For this reason, please do not send urgent requests through [place]. Please call the office at [phone]. New and ongoing conditions may require a visit. We have virtual and in person visit available for your convenience.  Complex MyChart concerns may require a visit. Your provider may request you schedule a virtual or in person visit to ensure  we are providing the best care possible. MyChart messages sent after 11:00 AM on Friday will not be received by the provider until Monday morning.    Lab and Test Results: You will receive your lab and test results on MyChart as soon as they are completed and results have been sent by the lab or testing facility. Due to this service, you will receive your results BEFORE your provider.  I review lab and tests results each morning prior  to seeing patients. Some results require collaboration with other providers to ensure you are receiving the most appropriate care. For this reason, we ask that you please allow a minimum of 3-5 business days from the time the ALL results have been received for your provider to receive and review lab and test results and contact you about these.  Most lab and test result comments from the provider will be sent through [place]. Your provider may recommend changes to the plan of care, follow-up visits, repeat testing, ask questions, or request an office visit to discuss these results. You may reply directly to this message or call the office at [phone] to provide information for the provider or set up an appointment. In some instances, you will be called with test results and recommendations. Please let us know if this is preferred and we will make note of this in your chart to provide this for you.    If you have not heard a response to your lab or test results in 5 business days from all results returning to [place], please call the office to let us know. We ask that you please avoid calling prior to this time unless there is an emergent concern. Due to high call volumes, this can delay the resulting process.  After Hours: For all non-emergency after hours needs, please call the office at [phone] and select the option to reach the on-call provider service. On-call services are shared between multiple [facility] offices and therefore it will not be possible to speak directly with your provider. On-call providers may provide medical advice and recommendations, but are unable to provide refills for maintenance medications.  For all emergency or urgent medical needs after normal business hours, we recommend that you seek care at the closest Urgent Care or Emergency Department to ensure appropriate treatment in a timely manner.  [facility] [place] has a 24 hour emergency room located on  the ground floor for your convenience.   Urgent Concerns During the Business Day Providers are seeing patients from 8AM to [place] with a busy schedule and are most often not able to respond to non-urgent calls until the end of the day or the next business day. If you should have URGENT concerns during the day, please call and speak to the nurse or schedule a same day appointment so that we can address your concern without delay.   Thank you, again, for choosing me as your health care partner. I appreciate your trust and look forward to learning more about you.   Worthy Keeler, DNP, AGNP-c ___________________________________________________________  Health Maintenance Recommendations Screening Testing Mammogram Every 1 -2 years based on history and risk factors Starting at age 67 Pap Smear Ages 21-39 every 3 years Ages 53-65 every 5 years with HPV testing More frequent testing may be required based on results and history Colon Cancer Screening Every 1-10 years based on test performed, risk factors, and history Starting at age 12 Bone Density Screening Every 2-10 years based on history Starting  at age 38 for women Recommendations for men differ based on medication usage, history, and risk factors AAA Screening One time ultrasound Men 38-109 years old who have every smoked Lung Cancer Screening Low Dose Lung CT every 12 months Age 55-80 years with a 30 pack-year smoking history who still smoke or who have quit within the last 15 years  Screening Labs Routine  Labs: Complete Blood Count (CBC), Complete Metabolic Panel (CMP), Cholesterol (Lipid Panel) Every 6-12 months based on history and medications May be recommended more frequently based on current conditions or previous results Hemoglobin A1c Lab Every 3-12 months based on history and previous results Starting at age 30 or earlier with diagnosis of diabetes, high cholesterol, BMI >26, and/or risk factors Frequent monitoring for  patients with diabetes to ensure blood sugar control Thyroid Panel (TSH w/ T3 & T4) Every 6 months based on history, symptoms, and risk factors May be repeated more often if on medication HIV One time testing for all patients 66 and older May be repeated more frequently for patients with increased risk factors or exposure Hepatitis C One time testing for all patients 41 and older May be repeated more frequently for patients with increased risk factors or exposure Gonorrhea, Chlamydia Every 12 months for all sexually active persons 13-24 years Additional monitoring may be recommended for those who are considered high risk or who have symptoms PSA Men 60-8 years old with risk factors Additional screening may be recommended from age 61-69 based on risk factors, symptoms, and history  Vaccine Recommendations Tetanus Booster All adults every 10 years Flu Vaccine All patients 6 months and older every year COVID Vaccine All patients 12 years and older Initial dosing with booster May recommend additional booster based on age and health history HPV Vaccine 2 doses all patients age 14-26 Dosing may be considered for patients over 26 Shingles Vaccine (Shingrix) 2 doses all adults 31 years and older Pneumonia (Pneumovax 23) All adults 28 years and older May recommend earlier dosing based on health history Pneumonia (Prevnar 17) All adults 26 years and older Dosed 1 year after Pneumovax 23  Additional Screening, Testing, and Vaccinations may be recommended on an individualized basis based on family history, health history, risk factors, and/or exposure.  __________________________________________________________  Diet Recommendations for All Patients  I recommend that all patients maintain a diet low in saturated fats, carbohydrates, and cholesterol. While this can be challenging at first, it is not impossible and small changes can make big differences.  Things to try: Decreasing the  amount of soda, sweet tea, and/or juice to one or less per day and replace with water While water is always the first choice, if you do not like water you may consider adding a water additive without sugar to improve the taste other sugar free drinks Replace potatoes with a brightly colored vegetable at dinner Use healthy oils, such as canola oil or olive oil, instead of butter or hard margarine Limit your bread intake to two pieces or less a day Replace regular pasta with low carb pasta options Bake, broil, or grill foods instead of frying Monitor portion sizes  Eat smaller, more frequent meals throughout the day instead of large meals  An important thing to remember is, if you love foods that are not great for your health, you don't have to give them up completely. Instead, allow these foods to be a reward when you have done well. Allowing yourself to still have special treats every once in a  while is a nice way to tell yourself thank you for working hard to keep yourself healthy.   Also remember that every day is a new day. If you have a bad day and "fall off the wagon", you can still climb right back up and keep moving along on your journey!  We have resources available to help you!  Some websites that may be helpful include: www.http://carter.biz/  Www.VeryWellFit.com _____________________________________________________________  Activity Recommendations for All Patients  I recommend that all adults get at least 20 minutes of moderate physical activity that elevates your heart rate at least 5 days out of the week.  Some examples include: Walking or jogging at a pace that allows you to carry on a conversation Cycling (stationary bike or outdoors) Water aerobics Yoga Weight lifting Dancing If physical limitations prevent you from putting stress on your joints, exercise in a pool or seated in a chair are excellent options.  Do determine your MAXIMUM heart rate for activity: YOUR AGE -  220 = MAX HeartRate   Remember! Do not push yourself too hard.  Start slowly and build up your pace, speed, weight, time in exercise, etc.  Allow your body to rest between exercise and get good sleep. You will need more water than normal when you are exerting yourself. Do not wait until you are thirsty to drink. Drink with a purpose of getting in at least 8, 8 ounce glasses of water a day plus more depending on how much you exercise and sweat.    If you begin to develop dizziness, chest pain, abdominal pain, jaw pain, shortness of breath, headache, vision changes, lightheadedness, or other concerning symptoms, stop the activity and allow your body to rest. If your symptoms are severe, seek emergency evaluation immediately. If your symptoms are concerning, but not severe, please let us know so that we can recommend further evaluation.

## 2021-03-26 NOTE — Assessment & Plan Note (Signed)
ADHD reportedly well controlled on 15 mg Adderall daily. No refills needed today. Will continue to monitor.

## 2021-03-26 NOTE — Assessment & Plan Note (Signed)
Patient reported this is not a chronic issue. Will review medical record for further evaluation. Lungs clear with no signs of fluid volume overload or edema present. Will obtain baseline labs today for office. Plan to follow-up in the next few weeks with CPE.

## 2021-03-26 NOTE — Assessment & Plan Note (Signed)
History of PTSD with previous treatment with citalopram and as needed Xanax. Patient reports this has been well controlled in the past however he is having some exacerbations of his anxiety.  He does not feel this is necessarily related to the PTSD but more situational at this time. Discussed with patient the importance of continuing medication to help eliminate future exacerbations and worsening symptoms. We will plan to start sertraline 50 mg daily.  Alprazolam 0.5 mg twice a day added for as needed use while medication is getting in his system.  We will also add zolpidem 5 mg at bedtime for symptoms of insomnia.  Patient may continue trazodone at bedtime.  We will monitor for symptom improvement at next visit in the next few weeks.  Patient does have a scheduled appointment with psychiatry coming up next week.

## 2021-03-26 NOTE — Assessment & Plan Note (Signed)
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 He is chronically anticoagulated at this time with Xarelto 20 mg daily. No signs of bleeding or edema. No signs of edema, warmth, erythema present to the skin.  No shortness of breath. Recommend continuation of Xarelto and will continue to monitor.  Patient aware of alarm symptoms that would warrant immediate evaluation.

## 2021-03-26 NOTE — Assessment & Plan Note (Signed)
Type 2 diabetes previously controlled with diet only. Will obtain A1c today for further evaluation. Patient does endorse that he has not been eating as he should and has gained approximately 22 pounds over the last few months. Patient does have increased cardiovascular risks in the setting of hypertension, elevated BMI, hyperlipidemia, and diabetes which can present significant risks. Based on A1c will make medication recommendations as well as diet and activity recommendations. Patient may likely benefit from GLP-1 medication to help with weight, lipids, hypertension, and diabetes.  Will discuss with patient at follow-up visit in the next few weeks.

## 2021-03-26 NOTE — Assessment & Plan Note (Signed)
BP elevated today with no alarm symptoms present. He has been out of his medication for several weeks which is likely contributing as well as increased anxiety and stressors recently. At this time will plan to restart losartan 20 mg and recommend close monitoring. Patient will return in the next few weeks for CPE and we will recheck blood pressure at that time. Labs today.

## 2021-03-26 NOTE — Progress Notes (Signed)
Orma Render, DNP, AGNP-c [facility] [address]   [place], [place] [address] [phone]  New patient visit   Patient: Dean Knox   DOB: 09-29-1960   61 y.o. Male  MRN: [id number] Visit Date: 03/26/2021  Patient Care Team: Ares Tegtmeyer, Coralee Pesa, NP as PCP - General (Nurse Practitioner)  Today's healthcare provider: Orma Render, NP   Chief Complaint  Patient presents with   Establish Care    Patient currently sees physicians with the [facility] that he going to continue to see   Insomnia    Patient states that his mental health is not well at the moment. He is dealing with anxiety attacks, PTSD, grief from the loss of his wife, and depression. He is currently out of his xanax and has been prescribed trazodone and atarax to help with sleep but states they are no longer therapeutic. Patient is requesting an alternative to help him sleep   Subjective    HPI HPI     Establish Care    Additional comments: Patient currently sees physicians with the [facility] that he going to continue to see        Insomnia    Additional comments: Patient states that his mental health is not well at the moment. He is dealing with anxiety attacks, PTSD, grief from the loss of his wife, and depression. He is currently out of his xanax and has been prescribed trazodone and atarax to help with sleep but states they are no longer therapeutic. Patient is requesting an alternative to help him sleep      Last edited by Bobby Rumpf, CMA on 03/26/2021  8:39 AM.      Dean Knox is a 61 y.o. male who presents today as a new patient to establish care.   [name] has previously received primary care service from the [facility]. He plans to continue some of his care with the [facility] but does need primary care services closer.  He is retired from the [facility] as of last year. He tells me that he retired after losing his wife to cancer.   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  His main concern today is depression and anxiety exacerbation. 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 He tells me until recently he feels these symptoms have been well controlled.  He has been on citalopram in the past for mental health. He tells me that he did not have exacerbation of symptoms while on the medication, but tells me he was not sure if he needed the medication. He did stop the medication last summer after the loss of his spouse. He tells me he was trying to eliminate medications that were not necessary at that time. He did have some withdrawal symptoms with brain zaps. He did taper off of the medication. He endorses he was having concerns with erectile dysfunction/libido while on the medication and he would like to avoid this, if possible.  He has used ativan and xanax in the past as needed, which have been helpful for breakthrough symptoms. He is currently out of this medication. He has used trazodone for sleep, but this is not helping at this time. He has used Ambien in the past for short term and  this worked well. He transitioned to trazodone when his mood stabilized to reduce risks associated with the medication.  He tells me since stopping his medication his mood has been worse. He reports he has been using alcohol to self-medicate and while he is not drinking every day, he does feel he drinks excessively when he does consume. He reports that his mood has significantly worsened after an incident this weekend with a friend of his. He tells me he is very concerned about the outcome of the incident and the serious nature. He endorses feelings of guilt, anxiety, and depression. He reports that this has triggered  severe symptoms and he is in need of help.  He does have an appointment with behavioral health set up for 2/4, but is concerned that he needs something to help control the symptoms until he can make to that visit.  He endorses anxiety, restlessness, fear, insomnia, feeling hopeless, feelings of failure, and difficulty relaxing. He denies any thoughts of self-harm at this time.    He endorses DM2 is a new diagnosis. He has been working to control this with diet. He was able to lose weight last year with dietary modifications, but he reports that he has been overeating the last few months and has gained about 22lbs.   He reports he had a history of blood clot and was found to have a factor 5 liden defiency. He is taking xarelto for this and has no issues or concerns. No signs of bleeding.   He endorses intermittent LE edema associated with venous insufficiency. He does have lasix to take when this is exacerbated, but has not had this problem recently.   He has a history of HTN and HLD for which he is prescribed medication. He tells me he has been out of his medication for HTN and has not taken this in several days. He tells me that he did not start the medication for HLD.  He does have a diagnosis of OSA, but tells me that he does not use his cpap on a regular basis. He is not sure if this is helpful.  He has a tear in his right elbow tendon tear and is working with [facility] for management.  He has a history of migraines, but reports these have been stable. Not currently taking any medications  He endorses a history of CHF. He tells me this was a one-time incident and is not on any medication for this or following cardiology.   He has a history of ADHD and is on adderall 15mg  a day and does well with this.   He has a history of allergies. He is on allegra daily and only takes this when needed. He reports an injection of solumedrol once a year keeps this at [place].   Past Medical History:   Diagnosis Date   Allergy [id number]   Anxiety 2003   PTSD   Arthritis 2003   Bilevel positive airway pressure (BPAP) dependence 03/26/2021   Blood glucose abnormal 03/26/2021   Cancer ([facility]) 2014   Skin. Melanoma   Chronic post-traumatic stress disorder (PTSD)    Clotting disorder ([facility]) 2017   Missing clotting factor   Congestive heart failure ([facility])    Cysts of right lower eyelid 03/26/2021   Depression 2003   Edema 03/26/2021   Embolism and thrombosis of artery ([facility]) 03/26/2021   Enchondroma of left femur 01/16/2020   Hyperlipidemia 2000   Hypersomnia with sleep apnea 03/26/2021   Hypertension  Keratitis 03/26/2021   Neck muscle spasm    Other chronic allergic conjunctivitis 03/26/2021   Est'd dx, not well controlled.  Patanol not working.  I wrote a script for Zaditor to replace Patanol.  f/u in one week.   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 No pertinent surgical history. Family Status  Relation Name Status   Mother  Alive   Father  Alive   Family History  Problem Relation Age of Onset   Healthy Mother    Healthy Father    Social History   Socioeconomic History   Marital status: Widowed    Spouse name: Not on file   Number of children: Not on file   Years of education: Not on file   Highest education level: Not on file  Occupational History   Occupation: retired    Comment: [employer]  Tobacco Use   Smoking status: Former    Packs/day: 1.00    Years: 15.00    Pack years: 15.00    Types: Cigarettes    Quit date: 03/02/2002    Years since quitting: 19.0   Smokeless tobacco: Never   Tobacco comments:    Have not smoked since quit date  Vaping Use   Vaping Use: Never used  Substance and Sexual Activity   Alcohol use: Yes    Alcohol/week: 9.0 - 12.0 standard drinks     Types: 9 - 12 Shots of liquor per week    Comment: Drink on a regular basis   Drug use: Never   Sexual activity: Yes    Birth control/protection: None  Other Topics Concern   Not on file  Social History Narrative   Not on file   Social Determinants of Health   Financial Resource Strain: Not on file  Food Insecurity: Not on file  Transportation Needs: Not on file  Physical Activity: Not on file  Stress: Stress Concern Present   Feeling of Stress : Very much  Social Connections: Not on file   Outpatient Medications Prior to Visit  Medication Sig   amphetamine-dextroamphetamine (ADDERALL) 15 MG tablet    DHS TAR 0.5 % shampoo once a week.   fexofenadine (ALLEGRA) 180 MG tablet Take 1 tablet (180 mg total) by mouth daily.   furosemide (LASIX) 40 MG tablet Take by mouth.   PSYLLIUM FIBER PO TAKE 2 TEASPOONFULS BY MOUTH TWICE A DAY (MIX IN 8 OUNCES OF WATER OR JUICE AND DRINK) INCREASE UNTIL GET 2-3 SOFT BMS DAILY   traZODone (DESYREL) 100 MG tablet Take 100 mg by mouth at bedtime.   triamcinolone ointment (KENALOG) 0.1 % triamcinolone acetonide 0.1 % topical ointment   XARELTO 20 MG TABS tablet Take 20 mg by mouth every morning.   [DISCONTINUED] ALPRAZolam (XANAX) 1 MG tablet alprazolam 1 mg tablet   [DISCONTINUED] atorvastatin (LIPITOR) 80 MG tablet Lipitor 80 mg tablet   [DISCONTINUED] augmented betamethasone dipropionate (DIPROLENE-AF) 0.05 % cream APPLY THIN FILM TO AFFECTED AREA DAILY   [DISCONTINUED] citalopram (CELEXA) 40 MG tablet Take 40 mg by mouth at bedtime.   [DISCONTINUED] fluticasone (FLONASE) 50 MCG/ACT nasal spray    [DISCONTINUED] hydrOXYzine (ATARAX) 10 MG tablet TAKE ONE TABLET BY MOUTH NIGHTLY AS NEEDED FOR ITCHING   [DISCONTINUED] hydrOXYzine (ATARAX/VISTARIL) 25 MG  tablet Take 1 tablet (25 mg total) by mouth every 6 (six) hours.   [DISCONTINUED] losartan (COZAAR) 100 MG tablet Take by mouth.   [DISCONTINUED] montelukast (SINGULAIR) 10 MG tablet Take 1 tablet by  mouth every evening.   [DISCONTINUED] sildenafil (VIAGRA) 100 MG tablet TAKE ONE-HALF TABLET BY MOUTH AS INSTRUCTED (TAKE 1 HOUR PRIOR TO SEXUAL ACTIVITY *DO NOT EXCEED 1 DOSE PER 24 HOUR PERIOD*)   [DISCONTINUED] amphetamine-dextroamphetamine (ADDERALL XR) 30 MG 24 hr capsule  (Patient not taking: Reported on 03/26/2021)   [DISCONTINUED] aspirin 81 MG EC tablet aspirin 81 mg tablet,delayed release   [DISCONTINUED] butalbital-aspirin-caffeine (FIORINAL) 50-325-40 MG capsule Take 2 capsules by mouth 2 (two) times daily as needed for headache (TAKE AT ONSET).   [DISCONTINUED] cetirizine (ZYRTEC) 10 MG tablet All Day Allergy (cetirizine) 10 mg tablet   [DISCONTINUED] ciprofloxacin (CIPRO) 500 MG tablet Take 1 tablet (500 mg total) by mouth 2 (two) times daily.   [DISCONTINUED] fluticasone (FLONASE) 50 MCG/ACT nasal spray Place 1-2 sprays into both nostrils daily for 7 days.   [DISCONTINUED] gabapentin (NEURONTIN) 100 MG capsule Take 2 capsules (200 mg total) by mouth at bedtime.   [DISCONTINUED] lisinopril (PRINIVIL,ZESTRIL) 20 MG tablet Take 20 mg by mouth at bedtime.   [DISCONTINUED] lisinopril (ZESTRIL) 40 MG tablet lisinopril 40 mg tablet   [DISCONTINUED] LORazepam (ATIVAN) 0.5 MG tablet lorazepam 0.5 mg tablet   [DISCONTINUED] olopatadine (PATANOL) 0.1 % ophthalmic solution Place 1 drop into both eyes 2 (two) times daily.   [DISCONTINUED] triamcinolone cream (KENALOG) 0.1 % Apply 1 application topically 2 (two) times daily.   No facility-administered medications prior to visit.   Allergies  Allergen Reactions   Lisinopril Cough   Penicillin G    Penicillin V Potassium    Penicillins Rash    Has patient had a PCN reaction causing immediate rash, facial/tongue/throat swelling, SOB or lightheadedness with hypotension: Yes Has patient had a PCN reaction causing severe rash involving mucus membranes or skin necrosis: No Has patient had a PCN reaction that required hospitalization No Has patient  had a PCN reaction occurring within the last 10 years: No If all of the above answers are "NO", then may proceed with Cephalosporin use.     Immunization History  Administered Date(s) Administered   Anthrax 08/26/2001, 09/09/2001, 10/31/2001, 04/24/2002, 11/28/2002   Hepatitis A, Adult 11/29/1995   Hepatitis B, adult 11/26/1995   Influenza Nasal 04/09/2003, 12/01/2003, 12/10/2004, 12/22/2005, 12/02/2006   Influenza Whole 12/08/1995, 12/07/1996, 12/31/1997, 02/14/1999, 01/06/2000, 01/11/2001, 02/07/2002   Influenza-Unspecified 04/10/2008   Meningococcal Polysaccharide 10/01/1999   Moderna Sars-Covid-2 Vaccination 05/25/2019, 06/21/2019   OPV 01/31/1983   PPD Test 01/01/1998, 11/13/1998, 10/01/1999, 04/24/2002, 08/12/2004, 09/29/2004   Pneumococcal Polysaccharide-23 04/26/2013   Smallpox 10/31/2001   Td 11/29/1992, 11/28/2002   Tdap 12/10/2004, 03/03/2007, 03/02/2013   Typhoid Live 08/06/1994   Typhoid Parenteral 10/01/1999   Yellow Fever 11/29/1992    Health Maintenance  Topic Date Due   HEMOGLOBIN A1C  Never done   FOOT EXAM  Never done   OPHTHALMOLOGY EXAM  Never done   HIV Screening  Never done   Hepatitis C Screening  Never done   Zoster Vaccines- Shingrix (1 of 2) Never done   COLONOSCOPY (Pts 45-37yrs Insurance coverage will need to be confirmed)  Never done   COVID-19 Vaccine (3 - Moderna risk series) 07/19/2019   INFLUENZA VACCINE  05/30/2021 (Originally 09/30/2020)   TETANUS/TDAP  03/03/2023   HPV VACCINES  Aged Out    Patient Care Team:  Rj Pedrosa, Coralee Pesa, NP as PCP - General (Nurse Practitioner)  Review of Systems All review of systems negative except what is listed in the HPI   Objective    BP (!) 140/110    Pulse 87    Ht 6\' 4"  (1.93 m)    Wt 298 lb 6.4 oz (135.4 kg)    SpO2 98%    BMI 36.32 kg/m  Physical Exam Vitals and nursing note reviewed.  Constitutional:      Appearance: Normal appearance.  HENT:     Head: Normocephalic.  Eyes:     Extraocular  Movements: Extraocular movements intact.     Conjunctiva/sclera: Conjunctivae normal.     Pupils: Pupils are equal, round, and reactive to light.  Neck:     Vascular: No carotid bruit.  Cardiovascular:     Rate and Rhythm: Normal rate and regular rhythm.     Pulses: Normal pulses.     Heart sounds: Normal heart sounds. No murmur heard. Pulmonary:     Effort: Pulmonary effort is normal.     Breath sounds: Normal breath sounds. No wheezing.  Abdominal:     General: Abdomen is flat. Bowel sounds are normal. There is no distension.     Palpations: Abdomen is soft.     Tenderness: There is no abdominal tenderness. There is no guarding.  Musculoskeletal:        General: Normal range of motion.     Cervical back: Normal range of motion. No tenderness.     Right lower leg: No edema.     Left lower leg: No edema.  Lymphadenopathy:     Cervical: No cervical adenopathy.  Skin:    General: Skin is warm and dry.     Capillary Refill: Capillary refill takes less than 2 seconds.     Comments: Bilateral LE purpuric discoloration consistent with venous insufficiency.   Neurological:     General: No focal deficit present.     Mental Status: He is alert and oriented to person, place, and time.     Motor: No weakness.  Psychiatric:        Attention and Perception: Attention normal.        Mood and Affect: Mood is anxious and depressed.        Behavior: Behavior normal. Behavior is cooperative.        Thought Content: Thought content normal.        Cognition and Memory: Cognition normal.        Judgment: Judgment normal.    Depression Screen PHQ 2/9 Scores 03/26/2021  PHQ - 2 Score 6  PHQ- 9 Score 18   No results found for any visits on 03/26/21.  Assessment & Plan      Problem List Items Addressed This Visit     Peripheral venous insufficiency    No edema present today. Skin dusky and purpuric but intact with palpable pedal pulses. No alarm signs present today  Recommend management  of CV disease and DM for optimal control.        Relevant Medications   furosemide (LASIX) 40 MG tablet   losartan (COZAAR) 100 MG tablet   tadalafil (CIALIS) 5 MG tablet   Other Relevant Orders   CBC with Differential/Platelet   Comprehensive metabolic panel   Lipid panel   Hemoglobin A1c   Attention deficit hyperactivity disorder, predominantly inattentive type    ADHD reportedly well controlled on 15 mg Adderall daily. No refills needed today. Will continue to  monitor.      Relevant Medications   ALPRAZolam (XANAX) 0.5 MG tablet   losartan (COZAAR) 100 MG tablet   sertraline (ZOLOFT) 50 MG tablet   zolpidem (AMBIEN) 5 MG tablet   tadalafil (CIALIS) 5 MG tablet   Other Relevant Orders   Ambulatory referral to Psychology   Essential hypertension - Primary    BP elevated today with no alarm symptoms present. He has been out of his medication for several weeks which is likely contributing as well as increased anxiety and stressors recently. At this time will plan to restart losartan 20 mg and recommend close monitoring. Patient will return in the next few weeks for CPE and we will recheck blood pressure at that time. Labs today.      Relevant Medications   furosemide (LASIX) 40 MG tablet   losartan (COZAAR) 100 MG tablet   tadalafil (CIALIS) 5 MG tablet   Other Relevant Orders   CBC with Differential/Platelet   Comprehensive metabolic panel   Lipid panel   Hemoglobin A1c   Congestive heart failure ([facility])    Patient reported this is not a chronic issue. Will review medical record for further evaluation. Lungs clear with no signs of fluid volume overload or edema present. Will obtain baseline labs today for office. Plan to follow-up in the next few weeks with CPE.       Relevant Medications   furosemide (LASIX) 40 MG tablet   losartan (COZAAR) 100 MG tablet   tadalafil (CIALIS) 5 MG tablet   Fatty liver    BMI is elevated today. Will obtain labs for  evaluation. Recommendations for weight loss provided for overall health management. We will plan to follow-up in the next few weeks for CPE and go over lab results.      Relevant Orders   CBC with Differential/Platelet   Comprehensive metabolic panel   Lipid panel   Hemoglobin A1c   Chronic post-traumatic stress disorder (PTSD)    History of PTSD with previous treatment with citalopram and as needed Xanax. Patient reports this has been well controlled in the past however he is having some exacerbations of his anxiety.  He does not feel this is necessarily related to the PTSD but more situational at this time. Discussed with patient the importance of continuing medication to help eliminate future exacerbations and worsening symptoms. We will plan to start sertraline 50 mg daily.  Alprazolam 0.5 mg twice a day added for as needed use while medication is getting in his system.  We will also add zolpidem 5 mg at bedtime for symptoms of insomnia.  Patient may continue trazodone at bedtime.  We will monitor for symptom improvement at next visit in the next few weeks.  Patient does have a scheduled appointment with psychiatry coming up next week.      Relevant Medications   ALPRAZolam (XANAX) 0.5 MG tablet   losartan (COZAAR) 100 MG tablet   sertraline (ZOLOFT) 50 MG tablet   zolpidem (AMBIEN) 5 MG tablet   tadalafil (CIALIS) 5 MG tablet   Other Relevant Orders   CBC with Differential/Platelet   Comprehensive metabolic panel   Lipid panel   Hemoglobin A1c   Ambulatory referral to Psychology   History of deep vein thrombosis    History of DVT in the setting of factor V Leiden deficiency. He is chronically anticoagulated at this time with Xarelto 20 mg daily. No signs of bleeding or edema. No signs of edema, warmth, erythema present to the  skin.  No shortness of breath. Recommend continuation of Xarelto and will continue to monitor.  Patient aware of alarm symptoms that would warrant  immediate evaluation.      Relevant Orders   CBC with Differential/Platelet   Comprehensive metabolic panel   Lipid panel   Hemoglobin A1c   Insomnia    Exacerbation of anxiety disorder with significant impairment on sleep. At this time feel that lack of sleep is likely exacerbating his anxiety and depression symptoms and may continue to contribute to decline.  Discussed with patient medication options that may be trialed for short-term.  To see if we can get sleep schedule under better control.  He has tolerated Ambien in the past.  We will try this again at 5 mg.  He does have an appointment with mental health set up next week therefore he can further discuss the recommendations with them and determine the most appropriate long-term medication management. Did discuss with patient the importance of not taking Xanax and Ambien at the same time.  He expresses understanding. Recommend follow-up if symptoms worsen or fail to improve once medication started.      Relevant Medications   ALPRAZolam (XANAX) 0.5 MG tablet   losartan (COZAAR) 100 MG tablet   sertraline (ZOLOFT) 50 MG tablet   zolpidem (AMBIEN) 5 MG tablet   tadalafil (CIALIS) 5 MG tablet   Other Relevant Orders   CBC with Differential/Platelet   Comprehensive metabolic panel   Lipid panel   Hemoglobin A1c   Ambulatory referral to Psychology   Hypercholesterolemia    Not currently on any statin therapy.  Will obtain labs today for evaluation. Patient is high risk of cardiovascular event with history of diabetes, hypertension, obesity, and elevated lipids. Will observe labs and determine next best course of action for her management. Diet and exercise information provided today.      Relevant Medications   furosemide (LASIX) 40 MG tablet   losartan (COZAAR) 100 MG tablet   tadalafil (CIALIS) 5 MG tablet   Other Relevant Orders   CBC with Differential/Platelet   Comprehensive metabolic panel   Lipid panel   Hemoglobin  A1c   Body mass index (BMI) of 36.0-36.9 in adult    BMI 36.32 today Recent increase in weight reported by patient. Recommendations for diet and activity provided on handout today. Will obtain labs to determine factors that could be contributing to weight gain. Patient may benefit from GLP-1 if hemoglobin A1c is found to be elevated. We will plan to follow-up at CPE in next few weeks and discuss further.      Obstructive sleep apnea    Longstanding history of OSA with intermittent use of CPAP. Patient does have uncontrolled hypertension today but is also been off of his blood pressure medication for a while now. Discussed with the patient the importance of using CPAP to help control blood pressures and reduce risks associated with uncontrolled sleep apnea. Will consider referral for neurology for further evaluation if patient is willing. Will review previous medical records for further evaluation.      Relevant Orders   CBC with Differential/Platelet   Comprehensive metabolic panel   Lipid panel   Hemoglobin A1c   Panic disorder    Longstanding history of depression, anxiety, PTSD, and panic disorder. Exacerbation currently due to recent increase stressors. Patient was able to speak to psychologist in the office today and has appointment scheduled for complete evaluation in the next 24 hours. We will plan to begin  sertraline 50 mg once a day as well as as needed Xanax for symptom management.  Also starting Ambien to help with sleep for short-term use. Patient is scheduled to see psychiatry next week.  Will defer further medication management to them. We will plan to follow-up in the next few weeks with CPE and to see how he was doing. No alarm symptoms present today.  Patient is aware if any changes or worsening of symptoms occur he can contact the office for further evaluation or seek emergency medical care.      Relevant Medications   ALPRAZolam (XANAX) 0.5 MG tablet   losartan  (COZAAR) 100 MG tablet   sertraline (ZOLOFT) 50 MG tablet   zolpidem (AMBIEN) 5 MG tablet   tadalafil (CIALIS) 5 MG tablet   Recurrent major depression (H[facility])    Major depressive disorder with acute exacerbation at this time. Patient did stop taking his medication last summer and has progressively experienced worsening symptoms. He did have an acute event occur over the weekend that has caused an exacerbation even further. Determined patient was experienced side effects from the medication including sexual dysfunction and is hesitant to restart citalopram at this time.  Discussed with patient the side effects of any SSRI/SNRI medications and recommend an alternative medication to see if symptoms are less severe.  He is willing to do this. At this time we will start sertraline 50 mg at bedtime and include as needed Xanax 0.5 mg up to twice a day for panic symptoms.  He may continue to take trazodone at bedtime.  We will also include Ambien for short-term at bedtime to help with sleep schedule. There are no alarm symptoms present today.  No SI/HI.  He was able to speak with a psychologist to the office today and has an appointment set up to be seen in the next 24 hours.  He will follow-up with psychiatry next week. Will defer further medication management to psychiatry unless input by primary care as needed. Patient is aware to seek medical attention immediately if his symptoms worsen or if he begins to have suicidal thoughts or behaviors. We will plan to follow-up in the next few weeks when CPE.  Encourage patient to keep appointments with psychology and psychiatry for evaluation and further management.      Relevant Medications   ALPRAZolam (XANAX) 0.5 MG tablet   losartan (COZAAR) 100 MG tablet   sertraline (ZOLOFT) 50 MG tablet   zolpidem (AMBIEN) 5 MG tablet   tadalafil (CIALIS) 5 MG tablet   Other Relevant Orders   CBC with Differential/Platelet   Comprehensive metabolic panel   Lipid  panel   Hemoglobin A1c   Ambulatory referral to Psychology   Diabetes mellitus ([place])    Type 2 diabetes previously controlled with diet only. Will obtain A1c today for further evaluation. Patient does endorse that he has not been eating as he should and has gained approximately 22 pounds over the last few months. Patient does have increased cardiovascular risks in the setting of hypertension, elevated BMI, hyperlipidemia, and diabetes which can present significant risks. Based on A1c will make medication recommendations as well as diet and activity recommendations. Patient may likely benefit from GLP-1 medication to help with weight, lipids, hypertension, and diabetes.  Will discuss with patient at follow-up visit in the next few weeks.      Relevant Medications   losartan (COZAAR) 100 MG tablet   Other Visit Diagnoses     Erectile dysfunction, unspecified erectile  dysfunction type       Relevant Medications   tadalafil (CIALIS) 5 MG tablet   Other Relevant Orders   CBC with Differential/Platelet   Comprehensive metabolic panel   Lipid panel   Hemoglobin A1c   Encounter for health-related screening       Relevant Orders   PSA Total (Reflex To Free)        Return for Next few weeks CPE. .    Time: 65 minutes, >50% spent counseling, care coordination, chart review, and documentation.    Lola Czerwonka, Coralee Pesa, NP, DNP, AGNP-C [facility] [place]

## 2021-03-26 NOTE — Assessment & Plan Note (Signed)
Longstanding history of depression, anxiety, PTSD, and panic disorder. Exacerbation currently due to recent increase stressors. Patient was able to speak to psychologist in the office today and has appointment scheduled for complete evaluation in the next 24 hours. We will plan to begin sertraline 50 mg once a day as well as as needed Xanax for symptom management.  Also starting Ambien to help with sleep for short-term use. Patient is scheduled to see psychiatry next week.  Will defer further medication management to them. We will plan to follow-up in the next few weeks with CPE and to see how he was doing. No alarm symptoms present today.  Patient is aware if any changes or worsening of symptoms occur he can contact the office for further evaluation or seek emergency medical care.

## 2021-03-26 NOTE — Assessment & Plan Note (Signed)
BMI is elevated today. Will obtain labs for evaluation. Recommendations for weight loss provided for overall health management. We will plan to follow-up in the next few weeks for CPE and go over lab results.

## 2021-03-26 NOTE — Assessment & Plan Note (Signed)
No edema present today. Skin dusky and purpuric but intact with palpable pedal pulses. No alarm signs present today  Recommend management of CV disease and DM for optimal control.

## 2021-03-26 NOTE — Assessment & Plan Note (Signed)
Not currently on any statin therapy.  Will obtain labs today for evaluation. Patient is high risk of cardiovascular event with history of diabetes, hypertension, obesity, and elevated lipids. Will observe labs and determine next best course of action for her management. Diet and exercise information provided today.

## 2021-03-27 ENCOUNTER — Ambulatory Visit (INDEPENDENT_AMBULATORY_CARE_PROVIDER_SITE_OTHER): Admitting: Psychologist

## 2021-03-27 DIAGNOSIS — F1020 Alcohol dependence, uncomplicated: Secondary | ICD-10-CM | POA: Diagnosis not present

## 2021-03-27 DIAGNOSIS — F4310 Post-traumatic stress disorder, unspecified: Secondary | ICD-10-CM

## 2021-03-27 DIAGNOSIS — F331 Major depressive disorder, recurrent, moderate: Secondary | ICD-10-CM

## 2021-03-27 DIAGNOSIS — Z634 Disappearance and death of family member: Secondary | ICD-10-CM | POA: Diagnosis not present

## 2021-03-27 LAB — PSA TOTAL (REFLEX TO FREE): Prostate Specific Ag, Serum: 0.8 ng/mL (ref 0.0–4.0)

## 2021-03-27 NOTE — Plan of Care (Signed)
Goals Alleviate depressive symptoms Recognize, accept, and cope with depressive feelings Develop healthy thinking patterns Develop healthy interpersonal relationships  Objectives Cooperate with a medication evaluation by a physician Verbalize an accurate understanding of depression Verbalize an understanding of the treatment Identify and replace thoughts that support depression Learn and implement behavioral strategies Verbalize an understanding and resolution of current interpersonal problems Learn and implement problem solving and decision making skills Learn and implement conflict resolution skills to resolve interpersonal problems Verbalize an understanding of healthy and unhealthy emotions verbalize insight into how past relationships may be influence current experiences with depression Use mindfulness and acceptance strategies and increase value based behavior  Increase hopeful statements about the future.  Interventions Consistent with treatment model, discuss how change in cognitive, behavioral, and interpersonal can help client alleviate depression CBT Behavioral activation help the client explore the relationship, nature of the dispute,  Help the client develop new interpersonal skills and relationships Conduct Problem so living therapy Teach conflict resolution skills Use a process-experiential approach Conduct TLDP Conduct ACT Evaluate need for psychotropic medication Monitor adherence to medication   Goals Reduce overall frequency, intensity, and duration of trauma related symptoms Stabilize  trauma while increasing ability to function Enhance ability to effectively cope with triggers, reminders, flashbacks, moods, and cognitions of trauma Learn and implement coping skills that result in a reduction of trauma related symptomsl  Objectives Verbalize an understanding of the cognitive, physiological, and behavioral components of post traumatic stress disorder Describe  in detail reminders of the triggers related to trauma  Learn and implement strategies to manage thoughts, behaviors, and feelings of trauma Learning and implement calming skills to reduce overall trauma Verbalize an understanding of the role that cognitive biases play in excessive irrational worry and persistent trauma symptoms Learn and implement personal skills to manage challenging situations Learn and implement problem solving strategies Identify and engage in pleasant activities Learn to accept limitations in life and commit to tolerating, rather than avoiding, unpleasant emotions while accomplishing meaningful goals Maintain involvement in work, family, and social activities Reestablish a consistent sleep-wake cycle Cooperate with a medical evaluation  Interventions Engage the patient in behavioral activation Use instruction, modeling, and role-playing to build the client's general social, communication, and/or conflict resolution skills Use Acceptance and Commitment Therapy to help client accept uncomfortable realities in order to accomplish value-consistent goals Support the client in following through with work, family, and social activities Teach and implement sleep hygiene practices  Refer the patient to a physician for a psychotropic medication consultation Monito the clint's psychotropic medication compliance Discuss how trauma typically involves excessive worry, various bodily expressions of tension, and avoidance of what is threatening that interact to maintain the problem  Teach the patient relaxation skills Assign the patient homework Discuss examples demonstrating that unrealistic worry overestimates the probability of threats and underestimates patient's ability  Assist the patient in analyzing his or her worries Help patient understand that avoidance is reinforcing  Goals Begin a healthy grieving process Objectives Tell in detail the story of the current loss that is  triggering symptoms Read books on the topic of grief Watch videos on the theme of grief Begin verbalizing feelings associated with the loss Attend a grief support group express thoughts and feelings about the deceased Identify and voice positives about the deceased implement acts of spiritual faith  Interventions create a safe environment and actively build trust use empathy, compassion, and support ask the patient to write a letter to the lost person conduct empty  chair ask the patient to discuss and list the positives and negative aspects of the person encourage patient to rely upon his/her spiritual faith  ask client to read books on grief ask patient to watch videos about grief assist patient in identifying emotions  ask patient to attend support group

## 2021-03-27 NOTE — Progress Notes (Signed)
[facility] Counselor Initial Adult Exam  Name: Dean Knox Date: 03/27/2021 MRN: [id number] DOB: [birth year] PCP: Dean Render, NP  Time spent: 11:00 am to 11:27 am; total time: 27 minutes  This session was held via video webex teletherapy due to the coronavirus risk at this time. The patient consented to video teletherapy and was located at his home during this session. He is aware it is the responsibility of the patient to secure confidentiality on his end of the session. The provider was in a private home office for the duration of this session. Limits of confidentiality were discussed with the patient.    Guardian/Payee:  NA    Paperwork requested: No   Reason for Visit /Presenting Problem: Depression, grief, PTSD, and alcohol use.   Mental Status Exam: Appearance:   Casual     Behavior:  Rationalizing and Resistant  Motor:  Normal  Speech/Language:   Clear and Coherent  Affect:  Flat  Mood:  normal  Thought process:  normal  Thought content:    WNL  Sensory/Perceptual disturbances:    WNL  Orientation:  oriented to person, place, and time/date  Attention:  Good  Concentration:  Good  Memory:  WNL  Fund of knowledge:   Fair  Insight:    Fair  Judgment:   Poor  Impulse Control:  Poor    Reported Symptoms:  The patient endorsed experiencing the following: feeling down, sad, tearful, social isolation, avoiding pleasurable activities, rumination of negative thoughts, low self-esteem, and thoughts of hopelessness. He denied suicidal and homicidal ideation.  The patient endorsed experiencing the following: directly experiencing a traumatic event, changes in mood, difficult interpersonal relationships, hypersensitivity to his surroundings, changes in cognition, flashbacks, avoids triggers, and some nightmares. He also endorsed insomnia and irritability.   The patient endorsed experiencing the following: persistent efforts to decrease alcohol use,  recurrent alcohol use, continued use despite interpersonal challenges, important social and occupational events have been given up, continued use despite knowing their is a psychological component.   Risk Assessment: Danger to Self:  No Self-injurious Behavior: No Danger to Others: No Duty to Warn:no Physical Aggression / Violence:No  Access to Firearms a concern: No  Gang Involvement:No  Patient / guardian was educated about steps to take if suicide or homicide risk level increases between visits: n/a While future psychiatric events cannot be accurately predicted, the patient does not currently require acute inpatient psychiatric care and does not currently meet [facility] involuntary commitment criteria.  Substance Abuse History: Current substance abuse: Yes   The patient stated that he is drinking between three to four days a week. Per the patient, he indicated that when he is drinking he consumes anywhere from 3-6 drinks at one time. Patient was a little guarded regarding providing all details of alcohol use.   Past Psychiatric History:   Previous psychological history is significant for depression and PTSD Outpatient Providers:Patient stated he has never been in counseling History of Psych Hospitalization: No  Psychological Testing:  NA    Abuse History:  Victim of: No.,  NA    Report needed: No. Victim of Neglect:No. Perpetrator of  NA   Witness / Exposure to Domestic Violence: No   Protective Services Involvement: No  Witness to [employer] Violence:  No   Family History:  Family History  Problem Relation Age of Onset   Healthy Mother    Healthy Father     Living situation: the patient lives alone  Sexual Orientation:  Straight  Relationship Status: Patient stated that he was widowed several years ago stating that his wife died from bladder cancer. He voiced that currently he is in a relationship with someone named Dean Knox  Name of spouse / other:Patient is dating  someone named Dean Knox If a parent, number of children / ages:Patient stated that he has two sons and that he has good relationships with them.   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   Military Service: Yes. 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 Per the patient he continued to work for the [employer] for over 26 years.  Educational History:  Education: Scientist, product/process development: Patient stated that he identifies as Christian   Any cultural differences that may affect / interfere with treatment:  NA  Recreation/Hobbies: Golfing  Stressors: Other: Patient indicated that it is all related to mental health     Strengths: Supportive Relationships  Barriers:  Alcohol use   Legal History: Pending legal issue / charges: The patient has no significant history of legal issues. History of legal issue / charges:  NA  Medical History/Surgical History: reviewed Past Medical History:  Diagnosis Date   Allergy [id number]   Anxiety 2003   PTSD   Arthritis 2003   Bilevel positive airway pressure (BPAP) dependence 03/26/2021   Blood glucose abnormal 03/26/2021   Cancer ([place]) 2014   Skin. 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  Patanol not working.  I wrote a script for Zaditor to replace Patanol.  f/u in one week.   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  Medications: Current Outpatient Medications  Medication Sig Dispense Refill   ALPRAZolam (XANAX) 0.5 MG tablet Take 1 tablet (0.5 mg total) by mouth 2 (two) times daily as needed for anxiety. Do not take at same time as Ambien. 45 tablet 1   amphetamine-dextroamphetamine (ADDERALL) 15 MG tablet      DHS TAR 0.5 % shampoo once a week.     fexofenadine (ALLEGRA) 180 MG tablet Take 1 tablet (180 mg total) by mouth daily. 30 tablet 0   furosemide (LASIX) 40 MG tablet Take by mouth.     losartan (COZAAR) 100 MG tablet Take 1 tablet (100 mg total) by mouth daily. 90 tablet 3   PSYLLIUM FIBER PO TAKE 2 TEASPOONFULS BY MOUTH TWICE A DAY (MIX IN 8 OUNCES OF WATER OR JUICE AND DRINK) INCREASE UNTIL GET 2-3 SOFT BMS DAILY     sertraline (ZOLOFT) 50 MG tablet Take 1 tablet (50 mg total) by mouth at bedtime. 30 tablet  3   tadalafil (CIALIS) 5 MG tablet Take 1 tablet (5 mg total) by mouth daily as needed for erectile dysfunction. 90 tablet 0   traZODone (DESYREL) 100 MG tablet Take 100 mg by mouth at bedtime.     triamcinolone ointment (KENALOG) 0.1 % triamcinolone acetonide 0.1 % topical ointment     XARELTO 20 MG TABS tablet Take 20 mg by mouth every morning.     zolpidem (AMBIEN) 5 MG tablet Take 1 tablet (5 mg total) by mouth at bedtime as needed for sleep. 30 tablet 1   No current facility-administered medications for this visit.    Allergies  Allergen Reactions   Lisinopril Cough   Penicillin G    Penicillin V Potassium    Penicillins Rash    Has patient had a PCN reaction causing immediate rash, facial/tongue/throat swelling, SOB or lightheadedness with hypotension: Yes Has patient had a PCN reaction causing severe rash involving mucus membranes or skin  necrosis: No Has patient had a PCN reaction that required hospitalization No Has patient had a PCN reaction occurring within the last 10 years: No If all of the above answers are "NO", then may proceed with Cephalosporin use.     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   Plan of Care: The patient is a 61 year old Caucasian male who was referred due to experiencing depression, PTSD, alcohol use, and grief. The patient lives alone. The patient meets criteria for a diagnosis of F33.1 major depressive affective disorder, recurrent, moderate based off of the following: feeling down, sad, tearful, social isolation, avoiding pleasurable activities, rumination of negative thoughts, low self-esteem, and thoughts of hopelessness. He denied suicidal and homicidal ideation. The patient meets criteria for a diagnosis of F43.10 posttraumatic stress disorder based off of the following: directly experiencing a traumatic event, changes in mood, difficult interpersonal relationships, hypersensitivity to his surroundings, changes in cognition, flashbacks, avoids triggers, and some nightmares. He also endorsed insomnia and irritability. The patient meets criteria for a diagnosis of F10.20 alcohol use disorder, moderate based off of the following: persistent efforts to decrease alcohol use, recurrent alcohol use, continued use despite interpersonal challenges, important social and occupational events have been given up, continued use despite knowing their is a psychological component. The patient meets criteria for a diagnosis of J19.1 uncomplicated bereavement.   The patient was unclear as to specific goals other than wanting help.  This psychologist makes the recommendation that the patient participate in a substance related disorder program to address alcohol use, however  patient was resistant to that concept stating he did not need it. The psychologist believes that the patient would benefit from intensive services, however it appears as though patient may be resistant to that idea at this time. The psychologist will offer therapy to attempt to assist the patient, however patient appears ambivalent about making some changes.    Conception Chancy, PsyD

## 2021-03-27 NOTE — Progress Notes (Signed)
                Dean Lonigro, PsyD 

## 2021-04-16 ENCOUNTER — Ambulatory Visit: Admitting: Psychologist

## 2021-04-18 ENCOUNTER — Encounter (HOSPITAL_BASED_OUTPATIENT_CLINIC_OR_DEPARTMENT_OTHER): Admitting: Nurse Practitioner

## 2021-05-12 ENCOUNTER — Ambulatory Visit (INDEPENDENT_AMBULATORY_CARE_PROVIDER_SITE_OTHER): Admitting: Nurse Practitioner

## 2021-05-12 ENCOUNTER — Encounter (HOSPITAL_BASED_OUTPATIENT_CLINIC_OR_DEPARTMENT_OTHER): Payer: Self-pay | Admitting: Nurse Practitioner

## 2021-05-12 ENCOUNTER — Other Ambulatory Visit: Payer: Self-pay

## 2021-05-12 VITALS — BP 130/100 | HR 66 | Ht 76.0 in | Wt 294.0 lb

## 2021-05-12 DIAGNOSIS — F900 Attention-deficit hyperactivity disorder, predominantly inattentive type: Secondary | ICD-10-CM

## 2021-05-12 DIAGNOSIS — F4001 Agoraphobia with panic disorder: Secondary | ICD-10-CM

## 2021-05-12 DIAGNOSIS — Z86718 Personal history of other venous thrombosis and embolism: Secondary | ICD-10-CM

## 2021-05-12 DIAGNOSIS — E7800 Pure hypercholesterolemia, unspecified: Secondary | ICD-10-CM

## 2021-05-12 DIAGNOSIS — I10 Essential (primary) hypertension: Secondary | ICD-10-CM

## 2021-05-12 DIAGNOSIS — G588 Other specified mononeuropathies: Secondary | ICD-10-CM

## 2021-05-12 DIAGNOSIS — E1165 Type 2 diabetes mellitus with hyperglycemia: Secondary | ICD-10-CM

## 2021-05-12 DIAGNOSIS — F4312 Post-traumatic stress disorder, chronic: Secondary | ICD-10-CM

## 2021-05-12 DIAGNOSIS — F332 Major depressive disorder, recurrent severe without psychotic features: Secondary | ICD-10-CM

## 2021-05-12 DIAGNOSIS — G4733 Obstructive sleep apnea (adult) (pediatric): Secondary | ICD-10-CM

## 2021-05-12 DIAGNOSIS — I509 Heart failure, unspecified: Secondary | ICD-10-CM | POA: Diagnosis not present

## 2021-05-12 DIAGNOSIS — G629 Polyneuropathy, unspecified: Secondary | ICD-10-CM | POA: Insufficient documentation

## 2021-05-12 DIAGNOSIS — F5104 Psychophysiologic insomnia: Secondary | ICD-10-CM

## 2021-05-12 DIAGNOSIS — F5221 Male erectile disorder: Secondary | ICD-10-CM | POA: Insufficient documentation

## 2021-05-12 DIAGNOSIS — K760 Fatty (change of) liver, not elsewhere classified: Secondary | ICD-10-CM

## 2021-05-12 DIAGNOSIS — Z6836 Body mass index (BMI) 36.0-36.9, adult: Secondary | ICD-10-CM

## 2021-05-12 MED ORDER — XARELTO 20 MG PO TABS: 20.0000 mg | ORAL_TABLET | Freq: Every morning | ORAL | 3 refills | Status: DC

## 2021-05-12 MED ORDER — ZOLPIDEM TARTRATE 5 MG PO TABS: 5.0000 mg | ORAL_TABLET | Freq: Every evening | ORAL | 3 refills | Status: DC | PRN

## 2021-05-12 MED ORDER — AMPHETAMINE-DEXTROAMPHET ER 30 MG PO CP24: 30.0000 mg | ORAL_CAPSULE | ORAL | 0 refills | Status: DC

## 2021-05-12 NOTE — Progress Notes (Signed)
BP (!) 130/100    Pulse 66    Ht 6' 4" (1.93 m)    Wt 294 lb (133.4 kg)    SpO2 95%    BMI 35.79 kg/m    Subjective:    Patient ID: Dean Knox, male    DOB: 18-Dec-1960, 61 y.o.   MRN: [id number]  HPI/Assessment/Physical Exam Dean Knox is a 61 y.o. male presenting on 05/12/2021 for comprehensive medical examination.   Current medical concerns include: Labs Recent labs reviewed with patient- PSA normal.  A1c shows new dx diabetes- patient has been working on changing his diet and increasing his activity levels. He does not wish to start on medication today and would like to try to manage this with diet. Recommend continuing with dietary changes and we will re-evaluate in 1 month to see where A1c is. He was not fasting at time of labs, so will plan to get updated fasting lipids in 4 weeks, as well.  Hgb and Hct are elevated- patient reports that he was likely dehydrated at the time labs were drawn as he had been drinking heavily. No symptoms.   Depression He reports that his depression symptoms have resolved. He feels these were situational related to an event that occurred shortly before his first visit. He tells me that he did take the sertraline for about 2 weeks but he experienced severe sexual dysfunction on the 23m dose and stopped the medication. Since that time he tells me that his sexual dysfunction has resolved. He reports that he is no longer drinking alcohol. At this time I feel that it is reasonable to stay off of the medication, but if symptoms return, I do recommend follow-up and further evaluation. He is in agreement with this plan.   BP Blood pressure is elevated today. Patient reports that he has had some family stress over the weekend with his girlfriends daughter who was found unresponsive [place] He reports that he has gone all weekend and this morning without his medications. He plans to take the medications  [place] He denies HA, CP, ShOB, LE edema, dizziness.   OSA Not using CPAP. He reports that he has one available but he does not feel he needs this at this time.   He reports regular vision exams q1-5y: yes He reports regular dental exams q [id number] His diet consists of:  been working to lower saturated fats and carbohydrates. Has stopped drinking alcohol.  He endorses exercise and/or activity of:  walking, yoga, lifting weights several days a week. Going to [employer] He works as:  retired  He denies ETOH use ( recently stopped this ) He denies nictoine use  He denies illegal substance use  He is sexually active with one partner and denies concerns today about STI  He denies concerns about skin changes today  He denies concerns about bowel changes today  He denies concerns about bladder changes today   Most Recent Depression Screen:  [facility] 05/12/2021 03/26/2021  Decreased Interest 0 3  Down, Depressed, Hopeless 0 3  PHQ - 2 Score 0 6  Altered sleeping - 3  Tired, decreased energy - 3  Change in appetite - 2  Feeling bad or failure about yourself  - 3  Trouble concentrating - 0  Moving slowly or fidgety/restless - 1  Suicidal thoughts - 0  PHQ-9 Score - 18  Difficult doing work/chores Not difficult at all Very difficult   Most Recent Anxiety Screen: GAD 7 : Generalized Anxiety Score 03/26/2021  Nervous, Anxious, on Edge 3  Control/stop worrying 3  Worry too much - different things 3  Trouble relaxing 3  Restless 2  Easily annoyed or irritable 0  Afraid - awful might happen 3  Total GAD 7 Score 17  Anxiety Difficulty Very difficult   Most Recent Falls Screen: Fall Risk  05/12/2021 03/26/2021  Falls in the past year? 0 0  Number falls in past yr: 0 0  Injury with Fall? 0 0  Risk for fall due to : No Fall Risks No Fall Risks  Follow up Falls evaluation completed Falls evaluation completed    Past medical history,  surgical history, medications, allergies, family history and social history reviewed with patient today and changes made to appropriate areas of the chart.  Past Medical History:  Past Medical History:  Diagnosis Date   Allergy [id number]   Anxiety 2003   PTSD   Arthritis 2003   Bilevel positive airway pressure (BPAP) dependence 03/26/2021   Blood glucose abnormal 03/26/2021   Cancer ([place]) 2014   Skin. 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  Patanol not working.  I wrote a script for Zaditor to replace Patanol.  f/u in one week.   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 Do not take at same time as Ambien.   DHS TAR 0.5 % shampoo once a week.   fexofenadine (ALLEGRA) 180 MG tablet Take 1 tablet (180 mg total) by mouth daily.   furosemide (LASIX) 40 MG tablet Take by mouth.   losartan (COZAAR) 100 MG tablet Take 1 tablet (100 mg total) by mouth daily.   PSYLLIUM FIBER PO TAKE 2 TEASPOONFULS BY MOUTH TWICE A DAY (MIX IN 8 OUNCES OF WATER OR JUICE AND DRINK) INCREASE UNTIL GET 2-3 SOFT BMS DAILY   tadalafil (CIALIS) 5 MG tablet Take 1  tablet (5 mg total) by mouth daily as needed for erectile dysfunction.   traZODone (DESYREL) 100 MG tablet Take 100 mg by mouth at bedtime.   triamcinolone ointment (KENALOG) 0.1 % triamcinolone acetonide 0.1 % topical ointment   No current facility-administered medications on file prior to visit.   Surgical History:  History reviewed. No pertinent surgical history. Allergies:  Allergies  Allergen Reactions   Lisinopril Cough   Penicillin G    Penicillin V Potassium    Penicillins Rash    Has patient had a PCN  reaction causing immediate rash, facial/tongue/throat swelling, SOB or lightheadedness with hypotension: Yes Has patient had a PCN reaction causing severe rash involving mucus membranes or skin necrosis: No Has patient had a PCN reaction that required hospitalization No Has patient had a PCN reaction occurring within the last 10 years: No If all of the above answers are "NO", then may proceed with Cephalosporin use.    Social History:  Social History   Socioeconomic History   Marital status: Widowed    Spouse name: Not on file   Number of children: Not on file   Years of education: Not on file   Highest education level: Not on file  Occupational History   Occupation: retired    Comment: [employer]  Tobacco Use   Smoking status: Former    Packs/day: 1.00    Years: 15.00    Pack years: 15.00    Types: Cigarettes    Quit date: 03/02/2002    Years since quitting: 19.2   Smokeless tobacco: Never   Tobacco comments:    Have not smoked since quit date  Vaping Use   Vaping Use: Never used  Substance and Sexual Activity   Alcohol use: Yes    Alcohol/week: 9.0 - 12.0 standard drinks    Types: 9 - 12 Shots of liquor per week    Comment: Drink on a regular basis   Drug use: Never   Sexual activity: Yes    Birth control/protection: None  Other Topics Concern   Not on file  Social History Narrative   Not on file   Social Determinants of Health   Financial Resource Strain:  Not on file  Food Insecurity: Not on file  Transportation Needs: Not on file  Physical Activity: Not on file  Stress: Stress Concern Present   Feeling of Stress : Very much  Social Connections: Not on file  Intimate Partner Violence: Not on file   Social History   Tobacco Use  Smoking Status Former   Packs/day: 1.00   Years: 15.00   Pack years: 15.00   Types: Cigarettes   Quit date: 03/02/2002   Years since quitting: 19.2  Smokeless Tobacco Never  Tobacco Comments   Have not smoked since quit date   Social History   Substance and Sexual Activity  Alcohol Use Yes   Alcohol/week: 9.0 - 12.0 standard drinks   Types: 9 - 12 Shots of liquor per week   Comment: Drink on a regular basis   Family History:  Family History  Problem Relation Age of Onset   Healthy Mother    Healthy Father      All ROS negative except what is listed above and in the HPI.      Objective:    BP (!) 130/100    Pulse 66    Ht 6' 4" (1.93 m)    Wt 294 lb (133.4 kg)    SpO2 95%    BMI 35.79 kg/m   Wt Readings from Last 3 Encounters:  05/12/21 294 lb (133.4 kg)  03/26/21 298 lb 6.4 oz (135.4 kg)  07/17/20 288 lb (130.6 kg)    Physical Exam Vitals and nursing note reviewed.  Constitutional:      General: He is not in acute distress.    Appearance: Normal appearance.  HENT:     Head: Normocephalic and atraumatic.     Right Ear: Hearing, tympanic membrane, ear canal and external ear normal.     Left Ear: Hearing, tympanic membrane, ear  canal and external ear normal.     Nose: Nose normal.     Right Sinus: No maxillary sinus tenderness or frontal sinus tenderness.     Left Sinus: No maxillary sinus tenderness or frontal sinus tenderness.     Mouth/Throat:     Lips: Pink.     Mouth: Mucous membranes are moist.     Pharynx: Oropharynx is clear.  Eyes:     General: Lids are normal. Vision grossly intact.     Extraocular Movements: Extraocular movements intact.     Conjunctiva/sclera:  Conjunctivae normal.     Pupils: Pupils are equal, round, and reactive to light.     Funduscopic exam:    Right eye: No hemorrhage. Red reflex present.        Left eye: No hemorrhage. Red reflex present.    Visual Fields: Right eye visual fields normal and left eye visual fields normal.  Neck:     Thyroid: No thyromegaly.     Vascular: No carotid bruit or JVD.  Cardiovascular:     Rate and Rhythm: Normal rate and regular rhythm.     Chest Wall: PMI is not displaced.     Pulses: Normal pulses.          Dorsalis pedis pulses are 2+ on the right side and 2+ on the left side.       Posterior tibial pulses are 2+ on the right side and 2+ on the left side.     Heart sounds: Normal heart sounds. No murmur heard. Pulmonary:     Effort: Pulmonary effort is normal. No respiratory distress.     Breath sounds: Normal breath sounds.  Chest:  Breasts:    Breasts are symmetrical.  Abdominal:     General: Bowel sounds are normal. There is no distension or abdominal bruit.     Palpations: Abdomen is soft. There is no hepatomegaly, splenomegaly or mass.     Tenderness: There is no abdominal tenderness. There is no right CVA tenderness, left CVA tenderness, guarding or rebound.  Musculoskeletal:        General: Normal range of motion.     Cervical back: Full passive range of motion without pain and neck supple. No tenderness. No spinous process tenderness or muscular tenderness.     Right lower leg: No edema.     Left lower leg: No edema.  Feet:     Right foot:     Toenail Condition: Right toenails are normal.     Left foot:     Toenail Condition: Left toenails are normal.  Lymphadenopathy:     Cervical: No cervical adenopathy.     Upper Body:     Right upper body: No supraclavicular adenopathy.     Left upper body: No supraclavicular adenopathy.  Skin:    General: Skin is warm and dry.     Capillary Refill: Capillary refill takes less than 2 seconds.     Nails: There is no clubbing.   Neurological:     General: No focal deficit present.     Mental Status: He is alert and oriented to person, place, and time.     Cranial Nerves: No cranial nerve deficit.     Sensory: Sensation is intact. No sensory deficit.     Motor: Motor function is intact. No weakness.     Coordination: Coordination is intact. Coordination normal.     Gait: Gait is intact. Gait normal.     Deep Tendon Reflexes: Reflexes normal.  Psychiatric:        Attention and Perception: Attention normal.        Mood and Affect: Mood normal.        Speech: Speech normal.        Behavior: Behavior normal. Behavior is cooperative.        Thought Content: Thought content normal.        Cognition and Memory: Cognition and memory normal.        Judgment: Judgment normal.    Results for orders placed or performed in visit on 03/26/21  CBC with Differential/Platelet  Result Value Ref Range   WBC 9.0 3.4 - 10.8 x10E3/uL   RBC 6.18 (H) 4.14 - 5.80 x10E6/uL   Hemoglobin 18.9 (H) 13.0 - 17.7 g/dL   Hematocrit 54.9 (H) 37.5 - 51.0 %   MCV 89 79 - 97 fL   MCH 30.6 26.6 - 33.0 pg   MCHC 34.4 31.5 - 35.7 g/dL   RDW 11.5 (L) 11.6 - 15.4 %   Platelets 204 150 - 450 x10E3/uL   Neutrophils 58 Not Estab. %   Lymphs 30 Not Estab. %   Monocytes 8 Not Estab. %   Eos 2 Not Estab. %   Basos 1 Not Estab. %   Neutrophils Absolute 5.3 1.4 - 7.0 x10E3/uL   Lymphocytes Absolute 2.7 0.7 - 3.1 x10E3/uL   Monocytes Absolute 0.7 0.1 - 0.9 x10E3/uL   EOS (ABSOLUTE) 0.1 0.0 - 0.4 x10E3/uL   Basophils Absolute 0.1 0.0 - 0.2 x10E3/uL   Immature Granulocytes 1 Not Estab. %   Immature Grans (Abs) 0.1 0.0 - 0.1 x10E3/uL  Comprehensive metabolic panel  Result Value Ref Range   Glucose 161 (H) 70 - 99 mg/dL   BUN 17 6 - 24 mg/dL   Creatinine, Ser 1.01 0.76 - 1.27 mg/dL   eGFR 86 >59 mL/min/1.73   BUN/Creatinine Ratio 17 9 - 20   Sodium 142 134 - 144 mmol/L   Potassium 4.5 3.5 - 5.2 mmol/L   Chloride 102 96 - 106 mmol/L   CO2 25  20 - 29 mmol/L   Calcium 9.7 8.7 - 10.2 mg/dL   Total Protein 7.2 6.0 - 8.5 g/dL   Albumin 4.9 3.8 - 4.9 g/dL   Globulin, Total 2.3 1.5 - 4.5 g/dL   Albumin/Globulin Ratio 2.1 1.2 - 2.2   Bilirubin Total 0.5 0.0 - 1.2 mg/dL   Alkaline Phosphatase 89 44 - 121 IU/L   AST 30 0 - 40 IU/L   ALT 46 (H) 0 - 44 IU/L  Lipid panel  Result Value Ref Range   Cholesterol, Total 286 (H) 100 - 199 mg/dL   Triglycerides 159 (H) 0 - 149 mg/dL   HDL 62 >39 mg/dL   VLDL Cholesterol Cal 29 5 - 40 mg/dL   LDL Chol Calc (NIH) 195 (H) 0 - 99 mg/dL   Lipid Comment: Comment    Chol/HDL Ratio 4.6 0.0 - 5.0 ratio  Hemoglobin A1c  Result Value Ref Range   Hgb A1c MFr Bld 6.8 (H) 4.8 - 5.6 %   Est. average glucose Bld gHb Est-mCnc 148 mg/dL  PSA Total (Reflex To Free)  Result Value Ref Range   Prostate Specific Ag, Serum 0.8 0.0 - 4.0 ng/mL   Reflex Criteria Comment       Assessment & Plan:  See details above Problem List Items Addressed This Visit     Peripheral neuropathy   Relevant Medications   zolpidem (AMBIEN) 5  MG tablet   amphetamine-dextroamphetamine (ADDERALL XR) 30 MG 24 hr capsule   amphetamine-dextroamphetamine (ADDERALL XR) 30 MG 24 hr capsule (Start on 06/09/2021)   amphetamine-dextroamphetamine (ADDERALL XR) 30 MG 24 hr capsule (Start on 07/07/2021)   Attention deficit hyperactivity disorder, predominantly inattentive type   Relevant Medications   amphetamine-dextroamphetamine (ADDERALL XR) 30 MG 24 hr capsule   amphetamine-dextroamphetamine (ADDERALL XR) 30 MG 24 hr capsule (Start on 06/09/2021)   amphetamine-dextroamphetamine (ADDERALL XR) 30 MG 24 hr capsule (Start on 07/07/2021)   Essential hypertension   Relevant Medications   XARELTO 20 MG TABS tablet   Other Relevant Orders   CBC with Differential/Platelet   Comprehensive metabolic panel   Lipid panel   Hemoglobin A1c   Congestive heart failure (HCC) - Primary   Relevant Medications   XARELTO 20 MG TABS tablet   Other  Relevant Orders   CBC with Differential/Platelet   Comprehensive metabolic panel   Lipid panel   Hemoglobin A1c   Fatty liver   Relevant Orders   CBC with Differential/Platelet   Comprehensive metabolic panel   Lipid panel   Hemoglobin A1c   Chronic post-traumatic stress disorder (PTSD)   Relevant Medications   zolpidem (AMBIEN) 5 MG tablet   History of deep vein thrombosis   Relevant Medications   XARELTO 20 MG TABS tablet   Other Relevant Orders   CBC with Differential/Platelet   Comprehensive metabolic panel   Lipid panel   Hemoglobin A1c   Insomnia   Relevant Medications   zolpidem (AMBIEN) 5 MG tablet   Hypercholesterolemia   Relevant Medications   XARELTO 20 MG TABS tablet   Other Relevant Orders   CBC with Differential/Platelet   Comprehensive metabolic panel   Lipid panel   Hemoglobin A1c   Body mass index (BMI) of 36.0-36.9 in adult   Relevant Orders   CBC with Differential/Platelet   Comprehensive metabolic panel   Lipid panel   Hemoglobin A1c   Obstructive sleep apnea   Relevant Orders   CBC with Differential/Platelet   Comprehensive metabolic panel   Lipid panel   Hemoglobin A1c   Recurrent major depression (HCC)   Relevant Medications   zolpidem (AMBIEN) 5 MG tablet   Diabetes mellitus (HCC)   Relevant Orders   CBC with Differential/Platelet   Comprehensive metabolic panel   Lipid panel   Hemoglobin A1c   Other Visit Diagnoses     Panic disorder with agoraphobia       Relevant Medications   zolpidem (AMBIEN) 5 MG tablet        Follow up plan: NEXT PREVENTATIVE PHYSICAL DUE IN 1 YEAR. Return in about 4 weeks (around 06/09/2021) for labs only- 4 months DM, HTN, Hypothyroid.  LABORATORY TESTING:  Health maintenance labs ordered today, if applicable.  - STI testing: deferred  IMMUNIZATIONS:   - Tdap: Tetanus vaccination status reviewed: last tetanus booster within 10 years. - Influenza: Refused - Pneumovax: Not applicable - Prevnar:  Not applicable - HPV: Not applicable - Zostavax vaccine: Given elsewhere  SCREENING: - Colonoscopy: Done elsewhere  Discussed with patient purpose of the colonoscopy is to detect colon cancer at curable precancerous or early stages  - AAA Screening: Not applicable  - Hearing Test: Not applicable  - Spirometry: Done elsewhere  - PSA: Up to date   PATIENT COUNSELING:   For all adult patients, I recommend A well balanced diet low in saturated fats, cholesterol, and moderation in carbohydrates.   This can be as simple  as monitoring portion sizes and cutting back on sugary beverages such as soda and juice to start with.    Daily water consumption of at least 64 ounces.  Physical activity at least 180 minutes per week, if just starting out.   This can be as simple as taking the stairs instead of the elevator and walking 2-3 laps around the office  purposefully every day.   STD protection, partner selection, and regular testing if high risk.  Limited consumption of alcoholic beverages if alcohol is consumed.  For women, I recommend no more than 7 alcoholic beverages per week, spread out throughout the week.  Avoid "binge" drinking or consuming large quantities of alcohol in one setting.   Please let me know if you feel you may need help with reduction or quitting alcohol consumption.   Avoidance of nicotine, if used.  Please let me know if you feel you may need help with reduction or quitting nicotine use.   Daily mental health attention.  This can be in the form of 5 minute daily meditation, prayer, journaling, yoga, reflection, etc.   Purposeful attention to your emotions and mental state can significantly improve your overall wellbeing  and  Health.  Please know that I am here to help you with all of your health care goals and am happy to work with you to find a solution that works best for you.  The greatest advice I have received with any changes in life are to take it one step at  a time, that even means if all you can focus on is the next 60 seconds, then do that and celebrate your victories.  With any changes in life, you will have set backs, and that is OK. The important thing to remember is, if you have a set back, it is not a failure, it is an opportunity to try again!  Health Maintenance Recommendations Screening Testing Mammogram Every 1 -2 years based on history and risk factors Starting at age 20 Pap Smear Ages 21-39 every 3 years Ages 41-65 every 5 years with HPV testing More frequent testing may be required based on results and history Colon Cancer Screening Every 1-10 years based on test performed, risk factors, and history Starting at age 19 Bone Density Screening Every 2-10 years based on history Starting at age 56 for women Recommendations for men differ based on medication usage, history, and risk factors AAA Screening One time ultrasound Men 58-79 years old who have every smoked Lung Cancer Screening Low Dose Lung CT every 12 months Age 74-80 years with a 30 pack-year smoking history who still smoke or who have quit within the last 15 years  Screening Labs Routine  Labs: Complete Blood Count (CBC), Complete Metabolic Panel (CMP), Cholesterol (Lipid Panel) Every 6-12 months based on history and medications May be recommended more frequently based on current conditions or previous results Hemoglobin A1c Lab Every 3-12 months based on history and previous results Starting at age 53 or earlier with diagnosis of diabetes, high cholesterol, BMI >26, and/or risk factors Frequent monitoring for patients with diabetes to ensure blood sugar control Thyroid Panel (TSH w/ T3 & T4) Every 6 months based on history, symptoms, and risk factors May be repeated more often if on medication HIV One time testing for all patients 50 and older May be repeated more frequently for patients with increased risk factors or exposure Hepatitis C One time testing  for all patients 62 and older May be repeated more  frequently for patients with increased risk factors or exposure Gonorrhea, Chlamydia Every 12 months for all sexually active persons 13-24 years Additional monitoring may be recommended for those who are considered high risk or who have symptoms PSA Men 82-8 years old with risk factors Additional screening may be recommended from age 69-69 based on risk factors, symptoms, and history  Vaccine Recommendations Tetanus Booster All adults every 10 years Flu Vaccine All patients 6 months and older every year COVID Vaccine All patients 12 years and older Initial dosing with booster May recommend additional booster based on age and health history HPV Vaccine 2 doses all patients age 73-26 Dosing may be considered for patients over 26 Shingles Vaccine (Shingrix) 2 doses all adults 93 years and older Pneumonia (Pneumovax 23) All adults 65 years and older May recommend earlier dosing based on health history Pneumonia (Prevnar 45) All adults 53 years and older Dosed 1 year after Pneumovax 23  Additional Screening, Testing, and Vaccinations may be recommended on an individualized basis based on family history, health history, risk factors, and/or exposure.

## 2021-05-12 NOTE — Patient Instructions (Addendum)
I would like to recheck your labs in about a month- that will give [place] a good idea of what your blood sugars and cholesterol is looking like. You can set this up for labs only when you leave today- you won't have to see me for this visit.  ?I will be in touch with you to let you know how things look.  ? ?I would like to see you back in 4 months just to follow-up and see how things are doing.  ? ? ?

## 2021-05-28 ENCOUNTER — Other Ambulatory Visit (HOSPITAL_BASED_OUTPATIENT_CLINIC_OR_DEPARTMENT_OTHER): Payer: Self-pay | Admitting: Nurse Practitioner

## 2021-05-28 DIAGNOSIS — F4312 Post-traumatic stress disorder, chronic: Secondary | ICD-10-CM

## 2021-05-28 DIAGNOSIS — F4001 Agoraphobia with panic disorder: Secondary | ICD-10-CM

## 2021-05-28 DIAGNOSIS — F5104 Psychophysiologic insomnia: Secondary | ICD-10-CM

## 2021-05-28 DIAGNOSIS — F332 Major depressive disorder, recurrent severe without psychotic features: Secondary | ICD-10-CM

## 2021-07-02 ENCOUNTER — Telehealth (HOSPITAL_BASED_OUTPATIENT_CLINIC_OR_DEPARTMENT_OTHER): Payer: Self-pay

## 2021-07-03 ENCOUNTER — Other Ambulatory Visit (HOSPITAL_BASED_OUTPATIENT_CLINIC_OR_DEPARTMENT_OTHER): Payer: Self-pay

## 2021-07-03 ENCOUNTER — Ambulatory Visit (HOSPITAL_BASED_OUTPATIENT_CLINIC_OR_DEPARTMENT_OTHER)
Admission: RE | Admit: 2021-07-03 | Discharge: 2021-07-03 | Disposition: A | Discharge status: Home / Self Care | Source: Ambulatory Visit | Attending: Nurse Practitioner | Admitting: Nurse Practitioner

## 2021-07-03 ENCOUNTER — Ambulatory Visit (HOSPITAL_BASED_OUTPATIENT_CLINIC_OR_DEPARTMENT_OTHER)

## 2021-07-03 DIAGNOSIS — K5792 Diverticulitis of intestine, part unspecified, without perforation or abscess without bleeding: Secondary | ICD-10-CM | POA: Diagnosis present

## 2021-07-03 MED ORDER — IOHEXOL 300 MG/ML  SOLN
100.0000 mL | Freq: Once | INTRAMUSCULAR | Status: AC | PRN
  Administered 2021-07-03: 100 mL via INTRAVENOUS

## 2021-07-03 MED ORDER — ONDANSETRON HCL 8 MG PO TABS: 8.0000 mg | ORAL_TABLET | Freq: Three times a day (TID) | ORAL | 1 refills | Status: AC | PRN

## 2021-07-03 MED ORDER — METRONIDAZOLE 500 MG PO TABS
500.0000 mg | ORAL_TABLET | Freq: Three times a day (TID) | ORAL | 0 refills | Status: AC
Start: 2021-07-03 — End: 2021-07-10

## 2021-07-03 MED ORDER — CIPROFLOXACIN HCL 500 MG PO TABS: 500.0000 mg | ORAL_TABLET | Freq: Two times a day (BID) | ORAL | 0 refills | Status: AC

## 2021-07-04 ENCOUNTER — Ambulatory Visit (HOSPITAL_BASED_OUTPATIENT_CLINIC_OR_DEPARTMENT_OTHER): Admission: RE | Admit: 2021-07-04 | Source: Ambulatory Visit

## 2021-07-04 LAB — POCT I-STAT CREATININE: Creatinine, Ser: 1 mg/dL (ref 0.61–1.24)

## 2021-07-05 ENCOUNTER — Encounter (HOSPITAL_BASED_OUTPATIENT_CLINIC_OR_DEPARTMENT_OTHER): Payer: Self-pay | Admitting: Nurse Practitioner

## 2021-07-05 DIAGNOSIS — F900 Attention-deficit hyperactivity disorder, predominantly inattentive type: Secondary | ICD-10-CM

## 2021-07-14 ENCOUNTER — Ambulatory Visit (HOSPITAL_BASED_OUTPATIENT_CLINIC_OR_DEPARTMENT_OTHER): Admitting: Cardiology

## 2021-07-14 ENCOUNTER — Encounter (HOSPITAL_BASED_OUTPATIENT_CLINIC_OR_DEPARTMENT_OTHER): Payer: Self-pay | Admitting: Cardiology

## 2021-07-14 VITALS — BP 140/80 | HR 66 | Ht 76.0 in | Wt 284.0 lb

## 2021-07-14 DIAGNOSIS — Z9189 Other specified personal risk factors, not elsewhere classified: Secondary | ICD-10-CM

## 2021-07-14 DIAGNOSIS — R072 Precordial pain: Secondary | ICD-10-CM

## 2021-07-14 DIAGNOSIS — I10 Essential (primary) hypertension: Secondary | ICD-10-CM | POA: Diagnosis not present

## 2021-07-14 DIAGNOSIS — E119 Type 2 diabetes mellitus without complications: Secondary | ICD-10-CM

## 2021-07-14 DIAGNOSIS — E7801 Familial hypercholesterolemia: Secondary | ICD-10-CM | POA: Diagnosis not present

## 2021-07-14 DIAGNOSIS — Z7189 Other specified counseling: Secondary | ICD-10-CM

## 2021-07-14 DIAGNOSIS — R079 Chest pain, unspecified: Secondary | ICD-10-CM

## 2021-07-14 MED ORDER — METOPROLOL TARTRATE 25 MG PO TABS
ORAL_TABLET | ORAL | 0 refills | Status: DC
Start: 2021-07-14 — End: 2021-09-23

## 2021-07-14 NOTE — Patient Instructions (Addendum)
Medication Instructions:  ?Your Physician recommend you continue on your current medication as directed.   ? ?*If you need a refill on your cardiac medications before your next appointment, please call your pharmacy* ? ? ?Lab Work: ?None ordered today ? ? ?Testing/Procedures: ?Cardiac CT Angiography (CTA), is a special type of CT scan that uses a computer to produce multi-dimensional views of major blood vessels throughout the body. In CT angiography, a contrast material is injected through an IV to help visualize the blood vessels  ?[facility]  ? ?Follow-Up: ?At [facility], you and your health needs are our priority.  As part of our continuing mission to provide you with exceptional heart care, we have created designated Provider Care Teams.  These Care Teams include your primary Cardiologist (physician) and Advanced Practice Providers (APPs -  Physician Assistants and Nurse Practitioners) who all work together to provide you with the care you need, when you need it. ? ?We recommend signing up for the patient portal called "MyChart".  Sign up information is provided on this After Visit Summary.  MyChart is used to connect with patients for Virtual Visits (Telemedicine).  Patients are able to view lab/test results, encounter notes, upcoming appointments, etc.  Non-urgent messages can be sent to your provider as well.   ?To learn more about what you can do with MyChart, go to [email or web address].   ? ?Your next appointment:   ?As needed ? ?The format for your next appointment:   ?In Person ? ?Provider:   ?Buford Dresser, MD{ ? ? ? ?Your cardiac CT will be scheduled at one of the below locations:  ? ?[facility] ?[address] ?[place], [place] [address] ?[phone] ? ? ?If scheduled at [facility], please arrive at the [facility] and Children's Entrance (Entrance C2) of [facility] 30 minutes prior to test start time. ?You can use the FREE valet parking offered at  entrance C (encouraged to control the heart rate for the test)  ?Proceed to the [facility] Radiology Department (first floor) to check-in and test prep. ? ?All radiology patients and guests should use entrance C2 at [facility], accessed from [facility], even though the hospital's physical address listed is [address]. ? ? ? ?If scheduled at [facility], please arrive 15 mins early for check-in and test prep. ? ?Please follow these instructions carefully (unless otherwise directed): ? ?Hold all erectile dysfunction medications at least 3 days (72 hrs) prior to test. ? ?On the Night Before the Test: ?Be sure to Drink plenty of water. ?Do not consume any caffeinated/decaffeinated beverages or chocolate 12 hours prior to your test. ?Do not take any antihistamines 12 hours prior to your test. ? ? ?On the Day of the Test: ?Drink plenty of water until 1 hour prior to the test. ?Do not eat any food 4 hours prior to the test. ? DO NOT take cialis within 3 days of the test. ?You may take your regular medications prior to the test.  ?Take metoprolol (Lopressor) 25 mg two hours prior to test. ?The day of the test, don't take the adderall , lasix or losartan ? ? ? ?     ?After the Test: ?Drink plenty of water. ?After receiving IV contrast, you may experience a mild flushed feeling. This is normal. ?On occasion, you may experience a mild rash up to 24 hours after the test. This is not dangerous. If this occurs, you can  take Benadryl 25 mg and increase your fluid intake. ?If you experience trouble breathing, this can be serious. If it is severe call 911 IMMEDIATELY. If it is mild, please call our office. ?If you take any of these medications: Glipizide/Metformin, Avandament, Glucavance, please do not take 48 hours after completing test unless otherwise instructed. ? ?We will call to schedule your test 2-4 weeks out understanding that some insurance companies will need an  authorization prior to the service being performed.  ? ?For non-scheduling related questions, please contact the cardiac imaging nurse navigator should you have any questions/concerns: ?Marchia Bond, Cardiac Imaging Nurse Navigator ?Gordy Clement, Cardiac Imaging Nurse Navigator ?[facility] ?Direct Office Dial: [phone]  ? ?For scheduling needs, including cancellations and rescheduling, please call [place], [phone]. ? ? ? ? ? ? ? ? ? ?

## 2021-07-14 NOTE — Progress Notes (Signed)
Cardiology Office Note:    Date:  07/14/2021   ID:  [name], DOB 1960-07-08, MRN [id number]  PCP:  Dean Render, NP  Cardiologist:  Buford Dresser, MD  Referring MD: Dean Render, NP   CC: new patient evaluation for cardiovascular risk  History of Present Illness:    Dean Knox is a 61 y.o. male with a hx of type II diabetes, hypertension, hypercholesterolemia, OSA not using CPAP, history of reported heart failure, history of DVT who is seen as a new consult at the request of Dean Knox, Dean Pesa, NP for the evaluation and management of cardiovascular risk.  Reviewed note from 05/12/21 from Dean Reedy, NP.   He is not sure why he is referred. There is no referral entry in the system.   Retired from the [employer] ~10 years ago, was a Health and safety inspector. After retirement, put on 110 lbs in 5 mos. Had a doctor tell him he was in heart failure. Lived in [place], [place] at the time. Has lost weight intentionally since then. Never required hospitalization. Has rare ankle swelling, has lasix as needed. Mother had varicose veins. 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   Cardiovascular risk factors: Prior clinical ASCVD: none that he knows of Comorbid conditions: hypertension for ~10 years, hyperlipidemia ~10-20 years, denies clinical diabetes but has had A1c 6.8 in the past--pending recheck, Denies chronic kidney disease  Metabolic syndrome/Obesity: highest adult weight 340 lbs. Chronic inflammatory conditions: none Tobacco use history: former, quit in 2004. 1 ppd x 15-20 years. Family history: father has HTN, mother had hypotension, but in their 64s and still living generally healthy lives. Sister healthy, is a physical therapist. Prior pertinent testing and/or incidental findings: none Exercise level: yard work, walks, works out, swims Current diet: doesn't like vegetables. Fasts in the AM, 3 eggs w/chicken breast or saus/cheese for lunch, then  eats at 6 pm--chicken, fish. High protein/low carb.  ASCVD risk 34%. Has avoided statins due to concern for side effects. We reviewed his lipids. Most recent Tchol 286, HDL 62, LDL 195, TG 159 (not fasting).   Rare chest pain, nonexertional. No clear triggers, sometimes after eating. No longer has acid reflux since having Nissen procedure.   Past Medical History:  Diagnosis Date   Allergy [id number]   Anxiety 2003   PTSD   Arthritis 2003   Bilevel positive airway pressure (BPAP) dependence 03/26/2021   Blood glucose abnormal 03/26/2021   Cancer ([place]) 2014   Skin. 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  Patanol not working.  I wrote a script for Zaditor to replace Patanol.  f/u in one week.   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 No pertinent surgical history.  Current Medications: Current Outpatient Medications on File Prior to Visit  Medication Sig   ALPRAZolam (XANAX) 0.5 MG tablet Take 1 tablet (0.5 mg total) by mouth 2 (two) times daily as needed for anxiety. Do not take at same time as Ambien.   amphetamine-dextroamphetamine (ADDERALL XR) 30 MG 24 hr capsule Take 1 capsule (30 mg total) by mouth every morning.   DHS TAR 0.5 % shampoo once a week.   fexofenadine (ALLEGRA) 180 MG tablet Take 1 tablet  (180 mg total) by mouth daily.   furosemide (LASIX) 40 MG tablet Take by mouth.   losartan (COZAAR) 100 MG tablet Take 1 tablet (100 mg total) by mouth daily.   ondansetron (ZOFRAN) 8 MG tablet Take 1 tablet (8 mg total) by mouth every 8 (eight) hours as needed for nausea or vomiting.   PSYLLIUM FIBER PO TAKE 2 TEASPOONFULS BY MOUTH TWICE A DAY (MIX IN 8 OUNCES OF WATER OR JUICE AND DRINK) INCREASE UNTIL GET 2-3 SOFT BMS DAILY   tadalafil (CIALIS) 5 MG tablet Take 1 tablet (5 mg total) by mouth daily as needed for erectile dysfunction.   traZODone (DESYREL) 100 MG tablet Take 100 mg by mouth at bedtime.   triamcinolone ointment (KENALOG) 0.1 % triamcinolone acetonide 0.1 % topical ointment   XARELTO 20 MG TABS tablet Take 1 tablet (20 mg total) by mouth every morning.   zolpidem (AMBIEN) 5 MG tablet TAKE 1 TABLET(5 MG) BY MOUTH AT BEDTIME AS NEEDED FOR SLEEP   amphetamine-dextroamphetamine (ADDERALL XR) 30 MG 24 hr capsule Take 1 capsule (30 mg total) by mouth every morning.   amphetamine-dextroamphetamine (ADDERALL XR) 30 MG 24 hr capsule Take 1 capsule (30 mg total) by mouth every morning. Last refill- please schedule phone visit for refills.   No current facility-administered medications on file prior to visit.     Allergies:   Lisinopril, Penicillin g, Penicillin v potassium, and Penicillins   Social History   Tobacco Use   Smoking status: Former    Packs/day: 1.00    Years: 15.00    Pack years: 15.00    Types: Cigarettes    Quit date: 03/02/2002    Years since quitting: 19.3   Smokeless tobacco: Never   Tobacco comments:    Have not smoked since quit date  Vaping Use   Vaping Use: Never used  Substance Use Topics   Alcohol use: Yes    Alcohol/week: 9.0 - 12.0 standard drinks    Types: 9 - 12 Shots of liquor per week    Comment: Drink on a regular basis   Drug use: Never    Family History: family history includes Healthy in his father and mother.  ROS:   Please see the  history of present illness.  Additional pertinent ROS: Constitutional: Negative for chills, fever, night sweats, unintentional weight loss  HENT: Negative for ear pain and hearing loss.   Eyes: Negative for loss of vision and eye pain.  Respiratory: Negative for cough, sputum, wheezing.   Cardiovascular: See HPI. Gastrointestinal: Negative for abdominal pain, melena, and hematochezia.  Genitourinary: Negative for dysuria and hematuria.  Musculoskeletal: Negative for falls and myalgias.  Skin: Negative for itching and rash.  Neurological: Negative for focal weakness, focal sensory changes and loss of consciousness.  Endo/Heme/Allergies: Does not bruise/bleed easily.     EKGs/Labs/Other Studies Reviewed:    The following studies were reviewed today: No prior cardiac procedures  EKG:  EKG is  personally reviewed.   07/14/21: NSR, LVH  Recent Labs: 03/26/2021: ALT 46; BUN 17; Hemoglobin 18.9; Platelets 204; Potassium 4.5; Sodium 142 07/03/2021: Creatinine, Ser 1.00  Recent Lipid Panel    Component Value Date/Time   CHOL 286 (H) 03/26/2021 1007   TRIG 159 (H) 03/26/2021 1007   HDL 62 03/26/2021 1007   CHOLHDL 4.6 03/26/2021 1007   LDLCALC 195 (H) 03/26/2021 1007    Physical Exam:    VS:  BP 140/80 (BP Location: Right Arm)   Pulse 66   Ht '6\' 4"'$  (1.93 m)   Wt 284 lb (128.8 kg)   SpO2 96%   BMI 34.57 kg/m     Wt Readings from Last 3 Encounters:  07/14/21 284 lb (128.8 kg)  05/12/21 294 lb (133.4 kg)  03/26/21 298 lb 6.4 oz (135.4 kg)    GEN: Well nourished, well developed in no acute distress HEENT: Normal, moist mucous membranes NECK: No JVD CARDIAC: regular rhythm, normal S1 and S2, no rubs or gallops. No murmur. VASCULAR: Radial and DP pulses 2+ bilaterally. No carotid bruits RESPIRATORY:  Clear to auscultation without rales, wheezing or rhonchi  ABDOMEN: Soft, non-tender, non-distended MUSCULOSKELETAL:  Ambulates independently SKIN: Warm and dry, no edema NEUROLOGIC:   Alert and oriented x 3. No focal neuro deficits noted. PSYCHIATRIC:  Normal affect    ASSESSMENT:    1. Chest pain of uncertain etiology   2. Heterozygous familial hypercholesterolemia   3. At increased risk for cardiovascular disease   4. Essential hypertension   5. Type 2 diabetes mellitus without complication, without long-term current use of insulin (HCC)   6. Cardiac risk counseling   7. Counseling on health promotion and disease prevention   8. Precordial pain    PLAN:    Chest pain -discussed treadmill stress, nuclear stress/lexiscan, and CT coronary angiography. Discussed pros and cons of each, including but not limited to false positive/false negative risk, radiation risk, and risk of IV contrast dye. Based on shared decision making, decision was made to pursue CT coronary angiography. -will give one time dose of metoprolol 2 hours prior to scheduled test. Hasn't taken adderall yet today, typically heart rate in the 80s. -had Cr 5.4.23, 1.00 -counseled on use of sublingual nitroglycerin and its importance to a good test   Elevated LDL, consistent with heterozygous familial hypercholesterolemia High ASCVD risk -last lipids with LDL 195 -we discussed the data on statins, both in terms of their long term benefit as well as the risk of side effects. Reviewed common misconceptions about statins. Reviewed how we monitor treatment.  -he would like to use the results of the CT to determine need for statin -counseled on red flag warning signs that need immediate medical attention  Hypertension -above goal today and hasn't taken adderall yet -continue losartan, furosemide -discussed home monitoring  Type II diabetes -if CAD found, recommend SLGT2i -last A1c 6.8  Cardiac risk counseling and prevention recommendations: -recommend heart healthy/Mediterranean diet, with whole grains, fruits, vegetable, fish, lean meats, nuts, and olive oil. Limit salt. -recommend moderate walking,  3-5 times/week for 30-50 minutes each session. Aim for at least 150 minutes.week. Goal should be pace of 3 miles/hours, or walking 1.5 miles in 30 minutes -recommend avoidance of tobacco products. Avoid excess alcohol. -ASCVD risk score: The 10-year ASCVD risk score (Arnett DK, et al., 2019) is: 34.6%   Values used to calculate the score:     Age: 57 years     Sex: Male  Is Non-Hispanic African American: No     Diabetic: Yes     Tobacco smoker: Yes     Systolic Blood Pressure: 229 mmHg     Is BP treated: Yes     HDL Cholesterol: 62 mg/dL     Total Cholesterol: 286 mg/dL    Plan for follow up: to be determined based on results of ct  Buford Dresser, MD, PhD, [facility]    Medication Adjustments/Labs and Tests Ordered: Current medicines are reviewed at length with the patient today.  Concerns regarding medicines are outlined above.  Orders Placed This Encounter  Procedures   CT CORONARY MORPH W/CTA COR W/SCORE W/CA W/CM &/OR WO/CM   Meds ordered this encounter  Medications   metoprolol tartrate (LOPRESSOR) 25 MG tablet    Sig: TAKE 1 TABLET 2 HR PRIOR TO CARDIAC PROCEDURE    Dispense:  1 tablet    Refill:  0    Patient Instructions  Medication Instructions:  Your Physician recommend you continue on your current medication as directed.    *If you need a refill on your cardiac medications before your next appointment, please call your pharmacy*   Lab Work: None ordered today   Testing/Procedures: Cardiac CT Angiography (CTA), is a special type of CT scan that uses a computer to produce multi-dimensional views of major blood vessels throughout the body. In CT angiography, a contrast material is injected through an IV to help visualize the blood vessels  [facility]   Follow-Up: At [facility], you and your health needs are our priority.  As part of our continuing mission to provide you with exceptional heart care, we have  created designated Provider Care Teams.  These Care Teams include your primary Cardiologist (physician) and Advanced Practice Providers (APPs -  Physician Assistants and Nurse Practitioners) who all work together to provide you with the care you need, when you need it.  We recommend signing up for the patient portal called "MyChart".  Sign up information is provided on this After Visit Summary.  MyChart is used to connect with patients for Virtual Visits (Telemedicine).  Patients are able to view lab/test results, encounter notes, upcoming appointments, etc.  Non-urgent messages can be sent to your provider as well.   To learn more about what you can do with MyChart, go to [email or web address].    Your next appointment:   As needed  The format for your next appointment:   In Person  Provider:   Buford Dresser, MD{    Your cardiac CT will be scheduled at one of the below locations:   [facility] [address] [place], [place] [address] [phone]   If scheduled at [facility], please arrive at the [facility] and Children's Entrance (Entrance C2) of [facility] 30 minutes prior to test start time. You can use the FREE valet parking offered at entrance C (encouraged to control the heart rate for the test)  Proceed to the [facility] Radiology Department (first floor) to check-in and test prep.  All radiology patients and guests should use entrance C2 at [facility], accessed from [facility], even though the hospital's physical address listed is [address].    If scheduled at [facility], please arrive 15 mins Dean Knox for check-in and test prep.  Please follow these instructions carefully (unless otherwise directed):  Hold all erectile dysfunction medications at least 3 days (72 hrs) prior  to test.  On the Night Before the Test: Be sure to Drink plenty of water. Do not consume any  caffeinated/decaffeinated beverages or chocolate 12 hours prior to your test. Do not take any antihistamines 12 hours prior to your test.   On the Day of the Test: Drink plenty of water until 1 hour prior to the test. Do not eat any food 4 hours prior to the test.  DO NOT take cialis within 3 days of the test. You may take your regular medications prior to the test.  Take metoprolol (Lopressor) 25 mg two hours prior to test. The day of the test, don't take the adderall , lasix or losartan         After the Test: Drink plenty of water. After receiving IV contrast, you may experience a mild flushed feeling. This is normal. On occasion, you may experience a mild rash up to 24 hours after the test. This is not dangerous. If this occurs, you can take Benadryl 25 mg and increase your fluid intake. If you experience trouble breathing, this can be serious. If it is severe call 911 IMMEDIATELY. If it is mild, please call our office. If you take any of these medications: Glipizide/Metformin, Avandament, Glucavance, please do not take 48 hours after completing test unless otherwise instructed.  We will call to schedule your test 2-4 weeks out understanding that some insurance companies will need an authorization prior to the service being performed.   For non-scheduling related questions, please contact the cardiac imaging nurse navigator should you have any questions/concerns: Marchia Bond, Cardiac Imaging Nurse Navigator Gordy Clement, Cardiac Imaging Nurse Navigator [facility] Direct Office Dial: [phone]   For scheduling needs, including cancellations and rescheduling, please call [place], [phone].           Signed, Buford Dresser, MD PhD 07/14/2021     [place]

## 2021-07-27 ENCOUNTER — Encounter (HOSPITAL_BASED_OUTPATIENT_CLINIC_OR_DEPARTMENT_OTHER): Payer: Self-pay | Admitting: Cardiology

## 2021-07-29 NOTE — Addendum Note (Signed)
Addended by: Gerald Stabs on: 07/29/2021 08:56 AM   Modules accepted: Orders

## 2021-07-31 MED ORDER — AMPHETAMINE-DEXTROAMPHET ER 30 MG PO CP24: 30.0000 mg | ORAL_CAPSULE | ORAL | 0 refills | Status: DC

## 2021-08-18 ENCOUNTER — Telehealth (HOSPITAL_COMMUNITY): Payer: Self-pay | Admitting: *Deleted

## 2021-08-18 NOTE — Telephone Encounter (Signed)
Reaching out to patient to offer assistance regarding upcoming cardiac imaging study; pt verbalizes understanding of appt date/time, parking situation and where to check in, pre-test NPO status and medications ordered, and verified current allergies; name and call back number provided for further questions should they arise  Dean Clement RN Navigator Cardiac Imaging [facility] [phone] office [phone] cell  Patient will hold his adderall on morning of test and take '25mg'$  metoprolol tartrate two hours prior to his cardiac CT scan. He is aware to arrive at 8am.

## 2021-08-20 ENCOUNTER — Ambulatory Visit (HOSPITAL_COMMUNITY)
Admission: RE | Admit: 2021-08-20 | Discharge: 2021-08-20 | Disposition: A | Discharge status: Home / Self Care | Source: Ambulatory Visit | Attending: Cardiology | Admitting: Cardiology

## 2021-08-20 VITALS — BP 141/99 | HR 58

## 2021-08-20 DIAGNOSIS — R072 Precordial pain: Secondary | ICD-10-CM | POA: Diagnosis not present

## 2021-08-20 DIAGNOSIS — E1165 Type 2 diabetes mellitus with hyperglycemia: Secondary | ICD-10-CM | POA: Insufficient documentation

## 2021-08-20 LAB — POCT I-STAT CREATININE: Creatinine, Ser: 0.8 mg/dL (ref 0.61–1.24)

## 2021-08-20 MED ORDER — IOHEXOL 350 MG/ML SOLN
100.0000 mL | Freq: Once | INTRAVENOUS | Status: AC | PRN
  Administered 2021-08-20: 100 mL via INTRAVENOUS

## 2021-08-20 MED ORDER — NITROGLYCERIN 0.4 MG SL SUBL
0.8000 mg | SUBLINGUAL_TABLET | Freq: Once | SUBLINGUAL | Status: AC
  Administered 2021-08-20: 0.8 mg via SUBLINGUAL

## 2021-08-20 MED ORDER — NITROGLYCERIN 0.4 MG SL SUBL
SUBLINGUAL_TABLET | SUBLINGUAL | Status: AC
  Filled 2021-08-20: qty 2

## 2021-09-11 ENCOUNTER — Ambulatory Visit (HOSPITAL_BASED_OUTPATIENT_CLINIC_OR_DEPARTMENT_OTHER)

## 2021-09-18 ENCOUNTER — Telehealth (HOSPITAL_BASED_OUTPATIENT_CLINIC_OR_DEPARTMENT_OTHER): Payer: Self-pay

## 2021-09-19 ENCOUNTER — Other Ambulatory Visit (HOSPITAL_BASED_OUTPATIENT_CLINIC_OR_DEPARTMENT_OTHER): Payer: Self-pay | Admitting: Nurse Practitioner

## 2021-09-19 DIAGNOSIS — F900 Attention-deficit hyperactivity disorder, predominantly inattentive type: Secondary | ICD-10-CM

## 2021-09-19 MED ORDER — AMPHETAMINE-DEXTROAMPHET ER 30 MG PO CP24: 30.0000 mg | ORAL_CAPSULE | ORAL | 0 refills | Status: DC

## 2021-09-23 ENCOUNTER — Ambulatory Visit (HOSPITAL_BASED_OUTPATIENT_CLINIC_OR_DEPARTMENT_OTHER): Admitting: Cardiology

## 2021-09-23 ENCOUNTER — Encounter (HOSPITAL_BASED_OUTPATIENT_CLINIC_OR_DEPARTMENT_OTHER): Payer: Self-pay | Admitting: Cardiology

## 2021-09-23 VITALS — BP 128/87 | HR 118 | Ht 76.0 in | Wt 290.6 lb

## 2021-09-23 DIAGNOSIS — Z79899 Other long term (current) drug therapy: Secondary | ICD-10-CM

## 2021-09-23 DIAGNOSIS — E119 Type 2 diabetes mellitus without complications: Secondary | ICD-10-CM | POA: Diagnosis not present

## 2021-09-23 DIAGNOSIS — I10 Essential (primary) hypertension: Secondary | ICD-10-CM | POA: Diagnosis not present

## 2021-09-23 DIAGNOSIS — I2510 Atherosclerotic heart disease of native coronary artery without angina pectoris: Secondary | ICD-10-CM

## 2021-09-23 DIAGNOSIS — E7801 Familial hypercholesterolemia: Secondary | ICD-10-CM

## 2021-09-23 DIAGNOSIS — Z7189 Other specified counseling: Secondary | ICD-10-CM

## 2021-09-23 MED ORDER — ROSUVASTATIN CALCIUM 20 MG PO TABS: 20.0000 mg | ORAL_TABLET | Freq: Every day | ORAL | 3 refills | Status: DC
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
## 2021-09-23 NOTE — Patient Instructions (Signed)
Medication Instructions:  START: Rosuvastatin 20 mg daily  *If you need a refill on your cardiac medications before your next appointment, please call your pharmacy*   Lab Work: Your provider has recommended lab work in September, 2023 (fasting lipid, LFT). Please have this collected at [facility] at [place]. The lab is open 8:00 am - 4:30 pm. Please avoid 12:00p - 1:00p for lunch hour. You do not need an appointment. Please go to [address] [place], [place] [address]. This is in the Primary Care office on the 3rd floor, let them know you are there for blood work and they will direct you to the lab.  If you have labs (blood work) drawn today and your tests are completely normal, you will receive your results only by: [place] (if you have MyChart) OR A paper copy in the mail If you have any lab test that is abnormal or we need to change your treatment, we will call you to review the results.   Testing/Procedures: None ordered today   Follow-Up: At [facility], you and your health needs are our priority.  As part of our continuing mission to provide you with exceptional heart care, we have created designated Provider Care Teams.  These Care Teams include your primary Cardiologist (physician) and Advanced Practice Providers (APPs -  Physician Assistants and Nurse Practitioners) who all work together to provide you with the care you need, when you need it.  We recommend signing up for the patient portal called "MyChart".  Sign up information is provided on this After Visit Summary.  MyChart is used to connect with patients for Virtual Visits (Telemedicine).  Patients are able to view lab/test results, encounter notes, upcoming appointments, etc.  Non-urgent messages can be sent to your provider as well.   To learn more about what you can do with MyChart, go to [email or web address].    Your next appointment:   1 year(s)  The format for your next  appointment:   In Person  Provider:   Buford Dresser, MD{

## 2021-09-25 ENCOUNTER — Telehealth: Payer: Self-pay | Admitting: Gastroenterology

## 2021-09-25 NOTE — Telephone Encounter (Signed)
I will look for those records.

## 2021-09-25 NOTE — Telephone Encounter (Signed)
Hi Dr. Tarri Glenn,  (Supervising Doc of the Day 08/27/21 p.m.)  This patient was referred to [place] from the [place] for transfer of care.  His records will be sent to you for your review.  Please let me know if you approve this transfer.  Thank you.

## 2021-10-01 LAB — COMPREHENSIVE METABOLIC PANEL
ALT: 33 IU/L (ref 0–44)
AST: 21 IU/L (ref 0–40)
Albumin/Globulin Ratio: 2.1 (ref 1.2–2.2)
Albumin: 4.5 g/dL (ref 3.8–4.9)
Alkaline Phosphatase: 85 IU/L (ref 44–121)
BUN/Creatinine Ratio: 19 (ref 9–20)
BUN: 16 mg/dL (ref 6–24)
Bilirubin Total: 0.5 mg/dL (ref 0.0–1.2)
CO2: 25 mmol/L (ref 20–29)
Calcium: 9.1 mg/dL (ref 8.7–10.2)
Chloride: 102 mmol/L (ref 96–106)
Creatinine, Ser: 0.86 mg/dL (ref 0.76–1.27)
Globulin, Total: 2.1 g/dL (ref 1.5–4.5)
Glucose: 144 mg/dL — ABNORMAL HIGH (ref 70–99)
Potassium: 4.2 mmol/L (ref 3.5–5.2)
Sodium: 140 mmol/L (ref 134–144)
Total Protein: 6.6 g/dL (ref 6.0–8.5)
eGFR: 100 mL/min/{1.73_m2} (ref >59)

## 2021-10-01 LAB — CBC WITH DIFFERENTIAL/PLATELET
Basophils Absolute: 0.1 10*3/uL (ref 0.0–0.2)
Basos: 1 %
EOS (ABSOLUTE): 0.2 10*3/uL (ref 0.0–0.4)
Eos: 3 %
Hematocrit: 48.2 % (ref 37.5–51.0)
Hemoglobin: 16.8 g/dL (ref 13.0–17.7)
Immature Grans (Abs): 0.1 10*3/uL (ref 0.0–0.1)
Immature Granulocytes: 1 %
Lymphocytes Absolute: 2.4 10*3/uL (ref 0.7–3.1)
Lymphs: 39 %
MCH: 30.9 pg (ref 26.6–33.0)
MCHC: 34.9 g/dL (ref 31.5–35.7)
MCV: 89 fL (ref 79–97)
Monocytes Absolute: 0.7 10*3/uL (ref 0.1–0.9)
Monocytes: 11 %
Neutrophils Absolute: 2.8 10*3/uL (ref 1.4–7.0)
Neutrophils: 45 %
Platelets: 169 10*3/uL (ref 150–450)
RBC: 5.44 x10E6/uL (ref 4.14–5.80)
RDW: 12.1 % (ref 11.6–15.4)
WBC: 6.2 10*3/uL (ref 3.4–10.8)

## 2021-10-01 LAB — LIPID PANEL
Chol/HDL Ratio: 4 ratio (ref 0.0–5.0)
Cholesterol, Total: 241 mg/dL — ABNORMAL HIGH (ref 100–199)
HDL: 60 mg/dL (ref >39)
LDL Chol Calc (NIH): 164 mg/dL — ABNORMAL HIGH (ref 0–99)
Triglycerides: 97 mg/dL (ref 0–149)
VLDL Cholesterol Cal: 17 mg/dL (ref 5–40)

## 2021-10-01 LAB — HEMOGLOBIN A1C
Est. average glucose Bld gHb Est-mCnc: 131 mg/dL
Hgb A1c MFr Bld: 6.2 % — ABNORMAL HIGH (ref 4.8–5.6)

## 2021-10-09 NOTE — Telephone Encounter (Signed)
Review of records from the [facility] showed an office visit in 2022 for persistent discomfort following severe diverticulitis.  At the time of this office visit he was having constant discomfort in the right lower quadrant in the same location of pain that he has had for 5 years.  Seen in the ED and diagnosed with acute sigmoid diverticulitis treated with Cipro and Flagyl.  The pain initially improved but there is been a persistent dull ache and a knot-like sensation in the area.  Colonoscopy was noted to be technically very difficult due to marked redundancy of the colon requiring external abdominal pressure, withdrawal of the scope, and reinsertion using torque and repositioning.  There was mild sigmoid diverticulosis.  It looks like a more recent colonoscopy in 2015 showed 3 tubular adenomas.  Surveillance colonoscopy recommended in 3 years.  CT of the chest abdomen and pelvis in 2018 showed fatty liver, prior granulomatous disease of the lungs, colonic diverticulosis, and fatty liver with mild splenomegaly.  It looks like he may have had a colonoscopy in 2020.  No family history of colon cancer or polyps.

## 2021-10-13 NOTE — Telephone Encounter (Signed)
Spoke with patient.  He just wants an OV to establish care.

## 2021-10-14 ENCOUNTER — Other Ambulatory Visit (HOSPITAL_BASED_OUTPATIENT_CLINIC_OR_DEPARTMENT_OTHER): Payer: Self-pay | Admitting: Nurse Practitioner

## 2021-10-14 DIAGNOSIS — F4001 Agoraphobia with panic disorder: Secondary | ICD-10-CM

## 2021-10-14 DIAGNOSIS — F5104 Psychophysiologic insomnia: Secondary | ICD-10-CM

## 2021-10-14 DIAGNOSIS — F4312 Post-traumatic stress disorder, chronic: Secondary | ICD-10-CM

## 2021-10-14 DIAGNOSIS — F332 Major depressive disorder, recurrent severe without psychotic features: Secondary | ICD-10-CM

## 2021-10-15 ENCOUNTER — Telehealth (HOSPITAL_BASED_OUTPATIENT_CLINIC_OR_DEPARTMENT_OTHER): Payer: Self-pay

## 2021-10-15 NOTE — Telephone Encounter (Signed)
Patient is leaving to go out of town on Friday, He needs a refill on Ambien.

## 2021-11-06 ENCOUNTER — Telehealth (HOSPITAL_BASED_OUTPATIENT_CLINIC_OR_DEPARTMENT_OTHER): Payer: Self-pay

## 2021-11-06 NOTE — Telephone Encounter (Signed)
Patient called he needs a refill on his Adderall.

## 2021-11-07 NOTE — Telephone Encounter (Signed)
Prescription filled for future 11/14/21

## 2021-11-10 ENCOUNTER — Other Ambulatory Visit (HOSPITAL_BASED_OUTPATIENT_CLINIC_OR_DEPARTMENT_OTHER): Payer: Self-pay | Admitting: Nurse Practitioner

## 2021-11-10 DIAGNOSIS — F900 Attention-deficit hyperactivity disorder, predominantly inattentive type: Secondary | ICD-10-CM

## 2021-11-10 MED ORDER — AMPHETAMINE-DEXTROAMPHET ER 30 MG PO CP24: 30.0000 mg | ORAL_CAPSULE | ORAL | 0 refills | Status: DC

## 2021-11-11 ENCOUNTER — Other Ambulatory Visit (HOSPITAL_BASED_OUTPATIENT_CLINIC_OR_DEPARTMENT_OTHER): Payer: Self-pay | Admitting: Nurse Practitioner

## 2021-11-11 DIAGNOSIS — F900 Attention-deficit hyperactivity disorder, predominantly inattentive type: Secondary | ICD-10-CM

## 2021-11-11 MED ORDER — AMPHETAMINE-DEXTROAMPHET ER 30 MG PO CP24: 30.0000 mg | ORAL_CAPSULE | ORAL | 0 refills | Status: DC

## 2021-11-12 ENCOUNTER — Other Ambulatory Visit (HOSPITAL_BASED_OUTPATIENT_CLINIC_OR_DEPARTMENT_OTHER): Payer: Self-pay

## 2021-11-12 DIAGNOSIS — F4312 Post-traumatic stress disorder, chronic: Secondary | ICD-10-CM

## 2021-11-12 DIAGNOSIS — I10 Essential (primary) hypertension: Secondary | ICD-10-CM

## 2021-11-12 DIAGNOSIS — F332 Major depressive disorder, recurrent severe without psychotic features: Secondary | ICD-10-CM

## 2021-11-12 DIAGNOSIS — F5104 Psychophysiologic insomnia: Secondary | ICD-10-CM

## 2021-11-12 DIAGNOSIS — F900 Attention-deficit hyperactivity disorder, predominantly inattentive type: Secondary | ICD-10-CM

## 2021-11-12 DIAGNOSIS — F4001 Agoraphobia with panic disorder: Secondary | ICD-10-CM

## 2021-11-12 MED ORDER — LOSARTAN POTASSIUM 100 MG PO TABS: 100.0000 mg | ORAL_TABLET | Freq: Every day | ORAL | 3 refills | Status: DC

## 2021-12-02 ENCOUNTER — Ambulatory Visit: Admitting: Gastroenterology

## 2021-12-16 ENCOUNTER — Telehealth (HOSPITAL_BASED_OUTPATIENT_CLINIC_OR_DEPARTMENT_OTHER): Payer: Self-pay

## 2021-12-16 DIAGNOSIS — F900 Attention-deficit hyperactivity disorder, predominantly inattentive type: Secondary | ICD-10-CM

## 2021-12-16 NOTE — Telephone Encounter (Signed)
Patient needs refill Adderall please call to [facility] ( [place])

## 2021-12-17 MED ORDER — AMPHETAMINE-DEXTROAMPHET ER 30 MG PO CP24: 30.0000 mg | ORAL_CAPSULE | ORAL | 0 refills | Status: DC

## 2021-12-17 NOTE — Telephone Encounter (Signed)
Refill sent x 3 months.

## 2022-01-26 ENCOUNTER — Telehealth (HOSPITAL_BASED_OUTPATIENT_CLINIC_OR_DEPARTMENT_OTHER): Payer: Self-pay | Admitting: Family Medicine

## 2022-01-26 DIAGNOSIS — E1165 Type 2 diabetes mellitus with hyperglycemia: Secondary | ICD-10-CM

## 2022-01-26 DIAGNOSIS — F900 Attention-deficit hyperactivity disorder, predominantly inattentive type: Secondary | ICD-10-CM

## 2022-01-26 NOTE — Telephone Encounter (Signed)
Pt called and requested his Adderall to be refilled with the pharmacy on file.  Thanks

## 2022-01-26 NOTE — Telephone Encounter (Signed)
Pt is calling to prescription of adderall.  Also Will have Flu shot at the [place] in Dec   Needs a referral for Ophthalmology that takes [employer]   Please call the pt to let know when complete. PT is aware Dr Burnard Bunting is out of office today

## 2022-01-27 MED ORDER — AMPHETAMINE-DEXTROAMPHET ER 30 MG PO CP24: 30.0000 mg | ORAL_CAPSULE | ORAL | 0 refills | Status: DC

## 2022-01-27 NOTE — Addendum Note (Signed)
Addended by: DE [place], [place] Amble J on: 01/27/2022 04:31 PM   Modules accepted: Orders

## 2022-01-28 NOTE — Telephone Encounter (Signed)
Appt 12-29

## 2022-01-29 ENCOUNTER — Other Ambulatory Visit (HOSPITAL_BASED_OUTPATIENT_CLINIC_OR_DEPARTMENT_OTHER): Payer: Self-pay | Admitting: Nurse Practitioner

## 2022-01-29 ENCOUNTER — Telehealth (HOSPITAL_BASED_OUTPATIENT_CLINIC_OR_DEPARTMENT_OTHER): Payer: Self-pay | Admitting: Family Medicine

## 2022-01-29 DIAGNOSIS — F5104 Psychophysiologic insomnia: Secondary | ICD-10-CM

## 2022-01-29 DIAGNOSIS — F4001 Agoraphobia with panic disorder: Secondary | ICD-10-CM

## 2022-01-29 DIAGNOSIS — F4312 Post-traumatic stress disorder, chronic: Secondary | ICD-10-CM

## 2022-01-29 DIAGNOSIS — F332 Major depressive disorder, recurrent severe without psychotic features: Secondary | ICD-10-CM

## 2022-01-29 NOTE — Telephone Encounter (Signed)
Pt is calling needs Dean Knox refilled --  Pharmacy [facility] [place] ( [address])

## 2022-02-19 ENCOUNTER — Encounter (HOSPITAL_BASED_OUTPATIENT_CLINIC_OR_DEPARTMENT_OTHER): Payer: Self-pay | Admitting: Family Medicine

## 2022-02-19 ENCOUNTER — Ambulatory Visit (HOSPITAL_BASED_OUTPATIENT_CLINIC_OR_DEPARTMENT_OTHER): Admitting: Family Medicine

## 2022-02-19 VITALS — BP 169/90 | HR 63 | Ht 76.0 in | Wt 285.0 lb

## 2022-02-19 DIAGNOSIS — M549 Dorsalgia, unspecified: Secondary | ICD-10-CM | POA: Insufficient documentation

## 2022-02-19 DIAGNOSIS — M546 Pain in thoracic spine: Secondary | ICD-10-CM | POA: Diagnosis not present

## 2022-02-19 MED ORDER — BACLOFEN 10 MG PO TABS: 10.0000 mg | ORAL_TABLET | Freq: Three times a day (TID) | ORAL | 0 refills | Status: DC

## 2022-02-19 NOTE — Progress Notes (Signed)
    Procedures performed today:    None.  Independent interpretation of notes and tests performed by another provider:   None.  Brief History, Exam, Impression, and Recommendations:    BP (!) 169/90 (BP Location: Left Arm, Patient Position: Sitting, Cuff Size: Large)   Pulse 63   Ht '6\' 4"'$  (1.93 m)   Wt 285 lb (129.3 kg)   SpO2 100%   BMI 34.69 kg/m   Back pain Patient reports that last week he began to have back pain.  Pain started in thoracic region, right side.  He does not recall any inciting event or injury which triggered start of symptoms.  Pain has been more noticeable with movement.  He has not noticed pain with direct palpation over the area.  He occasionally has had some low back pain in the past, denies any issues in the past with thoracic back pain.  He will have some radiation of symptoms into lower back.  Denies any numbness or tingling.  He did have some Flexeril at home and tried this since the pain started without significant relief. On exam, patient is in no acute distress. Thoracic spine: No tenderness to palpation over spinous processes.  No significant tenderness to palpation to paraspinal muscles in the thoracic region.  Patient does have reduced range of motion for forward flexion, rotation related to pain.  Gait mildly antalgic, otherwise normal. No red flags on history or exam.  Recommend proceeding with conservative measures including OTC medications.  We will trial alternative muscle relaxant with use of baclofen.  Discussed related to this medication.  Given past medical history and current medication regimen, recommend avoiding NSAIDs, can utilize Tylenol for pain relief.   Feel that patient would benefit from working with physical therapy, home exercise program as per PT, referral placed today   ___________________________________________ Esmae Donathan de [place], MD, ABFM, CA[facility] Primary [facility] and [place]

## 2022-02-19 NOTE — Assessment & Plan Note (Addendum)
Patient reports that last week he began to have back pain.  Pain started in thoracic region, right side.  He does not recall any inciting event or injury which triggered start of symptoms.  Pain has been more noticeable with movement.  He has not noticed pain with direct palpation over the area.  He occasionally has had some low back pain in the past, denies any issues in the past with thoracic back pain.  He will have some radiation of symptoms into lower back.  Denies any numbness or tingling.  He did have some Flexeril at home and tried this since the pain started without significant relief. On exam, patient is in no acute distress. Thoracic spine: No tenderness to palpation over spinous processes.  No significant tenderness to palpation to paraspinal muscles in the thoracic region.  Patient does have reduced range of motion for forward flexion, rotation related to pain.  Gait mildly antalgic, otherwise normal. No red flags on history or exam.  Recommend proceeding with conservative measures including OTC medications.  We will trial alternative muscle relaxant with use of baclofen.  Discussed related to this medication.  Given past medical history and current medication regimen, recommend avoiding NSAIDs, can utilize Tylenol for pain relief.   Feel that patient would benefit from working with physical therapy, home exercise program as per PT, referral placed today

## 2022-02-27 ENCOUNTER — Encounter (HOSPITAL_BASED_OUTPATIENT_CLINIC_OR_DEPARTMENT_OTHER): Payer: Self-pay | Admitting: Family Medicine

## 2022-02-27 ENCOUNTER — Ambulatory Visit (HOSPITAL_BASED_OUTPATIENT_CLINIC_OR_DEPARTMENT_OTHER): Admitting: Family Medicine

## 2022-02-27 VITALS — BP 140/98 | HR 75 | Temp 97.6°F | Ht 76.0 in | Wt 285.0 lb

## 2022-02-27 DIAGNOSIS — E785 Hyperlipidemia, unspecified: Secondary | ICD-10-CM | POA: Diagnosis not present

## 2022-02-27 DIAGNOSIS — E119 Type 2 diabetes mellitus without complications: Secondary | ICD-10-CM | POA: Insufficient documentation

## 2022-02-27 DIAGNOSIS — I10 Essential (primary) hypertension: Secondary | ICD-10-CM

## 2022-02-27 DIAGNOSIS — E1159 Type 2 diabetes mellitus with other circulatory complications: Secondary | ICD-10-CM | POA: Insufficient documentation

## 2022-02-27 DIAGNOSIS — M546 Pain in thoracic spine: Secondary | ICD-10-CM | POA: Diagnosis not present

## 2022-02-27 MED ORDER — PREDNISONE 20 MG PO TABS: 20.0000 mg | ORAL_TABLET | Freq: Every day | ORAL | 0 refills | Status: AC

## 2022-02-27 NOTE — Progress Notes (Signed)
Procedures performed today:    None.  Independent interpretation of notes and tests performed by another provider:   None.  Brief History, Exam, Impression, and Recommendations:    BP (!) 140/98 (BP Location: Right Arm, Patient Position: Sitting, Cuff Size: Large)   Pulse 75   Temp 97.6 F (36.4 C) (Oral)   Ht '6\' 4"'$  (1.93 m)   Wt 285 lb (129.3 kg)   SpO2 100%   BMI 34.69 kg/m   Diabetes mellitus (HCC) Diagnosis of diabetes made earlier this year in March 2023.  Unfortunately, patient has not had any further follow-up since that office visit in regards to managing new diagnosis of diabetes.  He is not currently taking any medications, has primarily been managing with lifestyle modifications.  Initial A1c at diagnosis was 6.8%, most recent was 6.2% a few months ago.  No reported issues currently with polyuria or polydipsia. Patient is due for recheck of hemoglobin A1c at this time He is also due for nephropathy screening, order placed today Will need to complete diabetic foot exam at next visit Will need to ensure that patient has retinopathy screening arranged/completed  Hyperlipidemia Patient continues with rosuvastatin.  Denies any issues with myalgias.  Has been tolerating medication.  Most recent lipid panel showed elevated total cholesterol as well as LDL. Will plan for recheck of cholesterol panel, this will be completed as fasting lab next week  Back pain Patient continues to report back pain.  Mostly unchanged since last appointment 1 week ago.  Given that symptoms have been going on for 2 weeks now, patient is requesting to have MRI completed as he feels that further evaluation is needed.  He has not had any other changes in regards to symptoms, no new symptoms such as numbness or tingling, no radiation of symptoms. He has been utilizing muscle relaxer, Tylenol to help with pain.  He did schedule physical therapy, however earliest opening was next week and he feels that  it is unreasonable to wait until that time. Discussed with patient that there is no indication currently to need to proceed with MRI at the present time.  Recommend continuing with OTC medications, muscle relaxer, home exercises to help with controlling pain.  Also discussed consideration of topical treatments such as lidocaine patches.  He did have requested regarding oral steroids today.  We discussed potential risks and benefits related to this including mania, increased anxiety, jitteriness, sleep disturbance, blood sugar impacts, avascular necrosis.  After discussion, patient elected to proceed with course of steroids, this was sent to pharmacy  On review of chart, patient has been receiving prescriptions for psychiatric medications through PCP.  Although he indicates that he follows with Stephannie Peters locally who he indicates is for counseling services and that she is a Education officer, museum.  However, on review of PMP, it appears that she has prescribed some of his medications in the past, including Adderall within the past couple years.  Looking into this further, she is a Designer, jewellery locally.  Thus it is somewhat unclear as to why prescriptions have been transitioned to PCP with patient continuing to see psychiatry.  Part of the concern is related to the different medications that patient is utilizing for underlying psychiatric diagnoses as well as lack of ideal control of underlying symptoms.  He also is requesting for [place] to have some way that he would be able to obtain 90-day supply of medication such as Adderall so that he is not having to request  refills when it comes due each month. Given that he is continuing to follow-up with psychiatry office, medications would ideally be managed by them, particularly if further optimization of medication regimen is needed given the types of medications that he is taking and associated risks with these.  Return in about 2 months (around  04/30/2022).   ___________________________________________ Ty Oshima de [place], MD, ABFM, CAQ[facility] [place]

## 2022-02-27 NOTE — Assessment & Plan Note (Signed)
Patient continues with rosuvastatin.  Denies any issues with myalgias.  Has been tolerating medication.  Most recent lipid panel showed elevated total cholesterol as well as LDL. Will plan for recheck of cholesterol panel, this will be completed as fasting lab next week

## 2022-02-27 NOTE — Assessment & Plan Note (Signed)
Patient continues to report back pain.  Mostly unchanged since last appointment 1 week ago.  Given that symptoms have been going on for 2 weeks now, patient is requesting to have MRI completed as he feels that further evaluation is needed.  He has not had any other changes in regards to symptoms, no new symptoms such as numbness or tingling, no radiation of symptoms. He has been utilizing muscle relaxer, Tylenol to help with pain.  He did schedule physical therapy, however earliest opening was next week and he feels that it is unreasonable to wait until that time. Discussed with patient that there is no indication currently to need to proceed with MRI at the present time.  Recommend continuing with OTC medications, muscle relaxer, home exercises to help with controlling pain.  Also discussed consideration of topical treatments such as lidocaine patches.  He did have requested regarding oral steroids today.  We discussed potential risks and benefits related to this including mania, increased anxiety, jitteriness, sleep disturbance, blood sugar impacts, avascular necrosis.  After discussion, patient elected to proceed with course of steroids, this was sent to pharmacy

## 2022-02-27 NOTE — Assessment & Plan Note (Signed)
Diagnosis of diabetes made earlier this year in March 2023.  Unfortunately, patient has not had any further follow-up since that office visit in regards to managing new diagnosis of diabetes.  He is not currently taking any medications, has primarily been managing with lifestyle modifications.  Initial A1c at diagnosis was 6.8%, most recent was 6.2% a few months ago.  No reported issues currently with polyuria or polydipsia. Patient is due for recheck of hemoglobin A1c at this time He is also due for nephropathy screening, order placed today Will need to complete diabetic foot exam at next visit Will need to ensure that patient has retinopathy screening arranged/completed

## 2022-03-05 ENCOUNTER — Encounter (HOSPITAL_BASED_OUTPATIENT_CLINIC_OR_DEPARTMENT_OTHER): Payer: Self-pay | Admitting: Physical Therapy

## 2022-03-05 ENCOUNTER — Ambulatory Visit (HOSPITAL_BASED_OUTPATIENT_CLINIC_OR_DEPARTMENT_OTHER)

## 2022-03-05 ENCOUNTER — Telehealth (HOSPITAL_BASED_OUTPATIENT_CLINIC_OR_DEPARTMENT_OTHER): Payer: Self-pay | Admitting: Family Medicine

## 2022-03-05 ENCOUNTER — Other Ambulatory Visit: Payer: Self-pay

## 2022-03-05 ENCOUNTER — Ambulatory Visit (HOSPITAL_BASED_OUTPATIENT_CLINIC_OR_DEPARTMENT_OTHER): Attending: Family Medicine | Admitting: Physical Therapy

## 2022-03-05 DIAGNOSIS — M546 Pain in thoracic spine: Secondary | ICD-10-CM | POA: Insufficient documentation

## 2022-03-05 DIAGNOSIS — F900 Attention-deficit hyperactivity disorder, predominantly inattentive type: Secondary | ICD-10-CM

## 2022-03-05 MED ORDER — AMPHETAMINE-DEXTROAMPHET ER 30 MG PO CP24: 30.0000 mg | ORAL_CAPSULE | ORAL | 0 refills | Status: DC

## 2022-03-05 NOTE — Telephone Encounter (Signed)
Please send Adderall to the pharmacy, please call if there is a problem

## 2022-03-05 NOTE — Therapy (Signed)
OUTPATIENT PHYSICAL THERAPY THORACOLUMBAR EVALUATION   Patient Name: Dean Knox MRN: [id number] DOB:January 23, 1961, 62 y.o., male Today's Date: 03/05/2022  END OF SESSION:  PT End of Session - 03/05/22 1605     Visit Number 1    Number of Visits 12    Date for PT Re-Evaluation 04/16/22    PT Start Time 7322    PT Stop Time 1228    PT Time Calculation (min) 43 min             Past Medical History:  Diagnosis Date   Allergy [id number]   Anxiety 2003   PTSD   Arthritis 2003   Bilevel positive airway pressure (BPAP) dependence 03/26/2021   Blood glucose abnormal 03/26/2021   Cancer ([place]) 2014   Skin. Melanoma   Chronic post-traumatic stress disorder (PTSD)    Clotting disorder ([place]) 2017   Missing clotting factor   Congestive heart failure ([place])    Cysts of right lower eyelid 03/26/2021   Depression 2003   Edema 03/26/2021   Embolism and thrombosis of artery ([place]) 03/26/2021   Enchondroma of left femur 01/16/2020   Hyperlipidemia 2000   Hypersomnia with sleep apnea 03/26/2021   Hypertension    Keratitis 03/26/2021   Neck muscle spasm    Other chronic allergic conjunctivitis 03/26/2021   Est'd dx, not well controlled.  Patanol not working.  I wrote a script for Zaditor to replace Patanol.  f/u in one week.   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 No pertinent surgical history. Patient Active Problem List   Diagnosis Date Noted   Diabetes mellitus ([place]) 02/27/2022   Back pain 02/19/2022   Nonocclusive coronary atherosclerosis of native coronary artery 09/23/2021   Heterozygous familial hypercholesterolemia 09/23/2021   Male erectile disorder (CODE) 05/12/2021   Peripheral neuropathy 05/12/2021   History of melanoma in situ 03/26/2021   Peripheral venous insufficiency  03/26/2021   Actinic keratosis 03/26/2021   Adenomatous polyp of colon 03/26/2021   Allergic rhinitis due to pollen 03/26/2021   Attention deficit hyperactivity disorder, predominantly inattentive type 03/26/2021   Essential hypertension 03/26/2021   Congestive heart failure ([place]) 03/26/2021   Diverticulitis of large intestine without perforation or abscess without bleeding 03/26/2021   Fatty liver 03/26/2021   Chronic post-traumatic stress disorder (PTSD) 03/26/2021   History of deep vein thrombosis 03/26/2021   History of malignant melanoma 03/26/2021   History of osteoarthritis 03/26/2021   Insomnia 03/26/2021   Lateral epicondylitis, right elbow 03/26/2021   Migraine 03/26/2021   Hyperlipidemia 03/26/2021   Body mass index (BMI) of 36.0-36.9 in adult 03/26/2021   Obstructive sleep apnea 03/26/2021   Panic disorder 03/26/2021   Psoriasis vulgaris 03/26/2021   Recurrent major depression ([place]) 03/26/2021   Seborrheic dermatitis 03/26/2021   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/26/2021   Osteoarthrosis, pelvic region and thigh 03/26/2021   Restless legs syndrome 23/76/2831   Umbilical hernia without obstruction or gangrene 03/26/2021   Nonallopathic lesion of cervical region 12/01/2019   Nonallopathic lesion of thoracic region 12/01/2019    PCP: Dr Raymond [place]   REFERRING PROVIDER: Dr Arlina Robes [place]   REFERRING DIAG: Mid Back Pain    Rationale for Evaluation and Treatment: Rehabilitation  THERAPY DIAG:  Pain in thoracic  spine  ONSET DATE: 3 weeks prior   SUBJECTIVE:                                                                                                                                                                                           SUBJECTIVE STATEMENT: Patient had an insidious onset of right mid thoracic pain starting 3 weeks prior.  He cannot remember mechanism of injury although he feels like he may have sneezed or coughed.  Patient is an avid  golfer and has not been able to golf.  He feels like certain movements cause significant sharp pain in the right side of his thoracic spine.  He has tried multiple stretches.  He is also tried anti-inflammatories as well as steroid with no significant improvement in pain.  PERTINENT HISTORY:  On a blood thinner 2nd to clotting disorder; DVT ; remote history of neck muscle spasm   PAIN:  Are you having pain? Yes: NPRS scale: 7/62 with certain movements  Pain location: right rib cage  Pain description: catches  Aggravating factors: certain movements shoot the pain level up  Relieving factors: rest   PRECAUTIONS: None  WEIGHT BEARING RESTRICTIONS: No  FALLS:  Has patient fallen in last 6 months? 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    PLOF: Independent  PATIENT GOALS:  To have less pain and return to golf   NEXT MD VISIT:   OBJECTIVE:   DIAGNOSTIC FINDINGS:  X-ray: (-)   PATIENT SURVEYS:  FOTO    SCREENING FOR RED FLAGS: Bowel or bladder incontinence: No Spinal tumors: No Cauda equina syndrome: No Compression fracture: No Abdominal aneurysm: No  COGNITION: Overall cognitive status: Within functional limits for tasks assessed     SENSATION: Denies Paresthesias   MUSCLE LENGTH:  POSTURE: No Significant postural limitations  PALPATION: Mild spasming and tenderness in the right mid thoracic paraspinals and out to the ribs    Thoracic ROM:   AROM eval  Flexion painful  Extension Painful past neutral   Right lateral flexion   Left lateral flexion   Right rotation Painful   Left rotation Painful   (Blank rows = not tested) UPPER EXTREMITY ROM:  Active ROM Right eval Left eval  Shoulder flexion Painful in the back    Shoulder extension    Shoulder abduction    Shoulder adduction    Shoulder extension    Shoulder internal rotation    Shoulder external rotation    Elbow flexion    Elbow extension    Wrist  flexion    Wrist extension  Wrist ulnar deviation    Wrist radial deviation    Wrist pronation    Wrist supination     (Blank rows = not tested)   LOWER EXTREMITY MMT:    MMT Right eval Left eval  Hip flexion Painful in the back    Hip extension    Hip abduction    Hip adduction    Hip internal rotation    Hip external rotation    Knee flexion    Knee extension    Ankle dorsiflexion    Ankle plantarflexion    Ankle inversion    Ankle eversion     (Blank rows = not tested)  GAIT: No abnormalities  TODAY'S TREATMENT:                                                                                                                              DATE: thoracic extension to neutral Trigger point release to mid thoracic spine.    PATIENT EDUCATION:  Education details: HEP, symptom management, anatomy of the condition  Person educated: Patient Education method: Explanation, Demonstration, Tactile cues, Verbal cues, and Handouts Education comprehension: verbalized understanding, returned demonstration, verbal cues required, tactile cues required, and needs further education  HOME EXERCISE PROGRAM: Thoracic extension to neutral   ASSESSMENT:  CLINICAL IMPRESSION: Patient is a 62 year old male who presents to therapy with right-sided midthoracic pain.  He has no memorable mechanism of injury.  He feels like he may have sneeze or cough at some point.  He has significant pain thoracic extension passed neutral.  He has increased pain on the right with the left thoracic rotation.  He has less pain with rotation to the right.  He also has pain with thoracic flexion.  He only has mild tenderness to palpation in the mid thoracic paraspinal with some tenderness to palpation extending out into the ribs.  Signs and symptoms are consistent with a thoracic disc dysfunction.  He was given light extension to neutral to work on for home.  He would benefit from skilled therapy to return to ADLs,  IADLs and golf. OBJECTIVE IMPAIRMENTS: decreased activity tolerance, decreased ROM, decreased strength, and pain.   ACTIVITY LIMITATIONS: carrying, lifting, bending, bed mobility, and reach over head  PARTICIPATION LIMITATIONS: meal prep, cleaning, laundry, driving, shopping, community activity, yard work, and golf  PERSONAL FACTORS: 1-2 comorbidities: abnormal clotting factor;   are also affecting patient's functional outcome.   REHAB POTENTIAL: Good  CLINICAL DECISION MAKING: Evolving/moderate complexity increasing pain with movement   EVALUATION COMPLEXITY: Moderate   GOALS: Goals reviewed with patient? Yes  SHORT TERM GOALS: Target date: 03/26/2022    Patient will flex shoulders overhead with a 50% reduction in thoracic pain Baseline: Goal status: INITIAL  2.  Patient will extend past neutral with thoracic spine with a 50% reduction in pain Baseline:  Goal status: INITIAL  3.  Patient will lie prone without a significant increase in pain Baseline:  Goal  status: INITIAL  LONG TERM GOALS: Target date: 04/16/2022    Patient will demonstrate full thoracic movement without increased pain in order to perform IADLs Baseline:  Goal status: INITIAL  2.  Patient will return to golf without increased pain Baseline:  Goal status: INITIAL  3.  Patient will be independent with full exercise program to improve core strength and decrease likelihood of reoccurrence of issue Baseline:  Goal status: INITIAL  PLAN:  PT FREQUENCY: 1-2x/week  PT DURATION: 6 weeks  PLANNED INTERVENTIONS: Therapeutic exercises, Therapeutic activity, Neuromuscular re-education, Patient/Family education, Self Care, Joint mobilization, Aquatic Therapy, Dry Needling, Spinal mobilization, Taping, Ultrasound, and Manual therapy.  PLAN FOR NEXT SESSION: Review tolerance to thoracic extension, begin light core strengthening as tolerated.  Consider supine wand flexion in pain-free range.  Assess general  mobility of thoracic spine.   Carney Living, PT 03/05/2022, 4:23 PM

## 2022-03-09 ENCOUNTER — Encounter (HOSPITAL_BASED_OUTPATIENT_CLINIC_OR_DEPARTMENT_OTHER): Payer: Self-pay | Admitting: Physical Therapy

## 2022-03-09 ENCOUNTER — Ambulatory Visit (HOSPITAL_BASED_OUTPATIENT_CLINIC_OR_DEPARTMENT_OTHER): Admitting: Physical Therapy

## 2022-03-09 DIAGNOSIS — M546 Pain in thoracic spine: Secondary | ICD-10-CM | POA: Diagnosis not present

## 2022-03-09 NOTE — Therapy (Signed)
OUTPATIENT PHYSICAL THERAPY THORACOLUMBAR EVALUATION   Patient Name: Dean Knox MRN: [id number] DOB:12-28-1960, 62 y.o., male Today's Date: 03/09/2022  END OF SESSION:  PT End of Session - 03/09/22 1952     Visit Number 2    Number of Visits 12    Date for PT Re-Evaluation 04/16/22    PT Start Time 1308    PT Stop Time 1356    PT Time Calculation (min) 41 min    Activity Tolerance Patient tolerated treatment well    Behavior During Therapy [facility] for tasks assessed/performed             Past Medical History:  Diagnosis Date   Allergy [id number]   Anxiety 2003   PTSD   Arthritis 2003   Bilevel positive airway pressure (BPAP) dependence 03/26/2021   Blood glucose abnormal 03/26/2021   Cancer ([place]) 2014   Skin. Melanoma   Chronic post-traumatic stress disorder (PTSD)    Clotting disorder ([place]) 2017   Missing clotting factor   Congestive heart failure ([place])    Cysts of right lower eyelid 03/26/2021   Depression 2003   Edema 03/26/2021   Embolism and thrombosis of artery ([place]) 03/26/2021   Enchondroma of left femur 01/16/2020   Hyperlipidemia 2000   Hypersomnia with sleep apnea 03/26/2021   Hypertension    Keratitis 03/26/2021   Neck muscle spasm    Other chronic allergic conjunctivitis 03/26/2021   Est'd dx, not well controlled.  Patanol not working.  I wrote a script for Zaditor to replace Patanol.  f/u in one week.   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 No pertinent surgical history. Patient Active Problem List   Diagnosis Date Noted   Diabetes mellitus ([place]) 02/27/2022   Back pain 02/19/2022   Nonocclusive coronary atherosclerosis of native coronary artery 09/23/2021   Heterozygous familial hypercholesterolemia 09/23/2021   Male erectile disorder (CODE)  05/12/2021   Peripheral neuropathy 05/12/2021   History of melanoma in situ 03/26/2021   Peripheral venous insufficiency 03/26/2021   Actinic keratosis 03/26/2021   Adenomatous polyp of colon 03/26/2021   Allergic rhinitis due to pollen 03/26/2021   Attention deficit hyperactivity disorder, predominantly inattentive type 03/26/2021   Essential hypertension 03/26/2021   Congestive heart failure ([place]) 03/26/2021   Diverticulitis of large intestine without perforation or abscess without bleeding 03/26/2021   Fatty liver 03/26/2021   Chronic post-traumatic stress disorder (PTSD) 03/26/2021   History of deep vein thrombosis 03/26/2021   History of malignant melanoma 03/26/2021   History of osteoarthritis 03/26/2021   Insomnia 03/26/2021   Lateral epicondylitis, right elbow 03/26/2021   Migraine 03/26/2021   Hyperlipidemia 03/26/2021   Body mass index (BMI) of 36.0-36.9 in adult 03/26/2021   Obstructive sleep apnea 03/26/2021   Panic disorder 03/26/2021   Psoriasis vulgaris 03/26/2021   Recurrent major depression ([place]) 03/26/2021   Seborrheic dermatitis 03/26/2021   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/26/2021   Osteoarthrosis, pelvic region and thigh 03/26/2021   Restless legs syndrome 29/52/8413   Umbilical hernia without obstruction or gangrene 03/26/2021   Nonallopathic lesion of cervical region 12/01/2019   Nonallopathic lesion of thoracic region 12/01/2019    PCP: Dr Raymond [place]   REFERRING PROVIDER: Dr Arlina Robes [place]   REFERRING DIAG:  Mid Back Pain    Rationale for Evaluation and Treatment: Rehabilitation  THERAPY DIAG:  Pain in thoracic spine  ONSET DATE: 3 weeks prior   SUBJECTIVE:                                                                                                                                                                                           SUBJECTIVE STATEMENT: Patient reports the intensity of the pain is decreased but his pain is now  radiating further around his rib cage.  He feels like it is easier to get in and out of bed. PERTINENT HISTORY:  On a blood thinner 2nd to clotting disorder; DVT ; remote history of neck muscle spasm   PAIN:  Are you having pain? Yes: NPRS scale: 6/38 with certain movements  Pain location: right rib cage  Pain description: catches  Aggravating factors: certain movements shoot the pain level up  Relieving factors: rest   PRECAUTIONS: None  WEIGHT BEARING RESTRICTIONS: No  FALLS:  Has patient fallen in last 6 months? 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    PLOF: Independent  PATIENT GOALS:  To have less pain and return to golf   NEXT MD VISIT:   OBJECTIVE:   DIAGNOSTIC FINDINGS:  X-ray: (-)   PATIENT SURVEYS:  FOTO    SCREENING FOR RED FLAGS: Bowel or bladder incontinence: No Spinal tumors: No Cauda equina syndrome: No Compression fracture: No Abdominal aneurysm: No  COGNITION: Overall cognitive status: Within functional limits for tasks assessed     SENSATION: Denies Paresthesias   MUSCLE LENGTH:  POSTURE: No Significant postural limitations  PALPATION: Mild spasming and tenderness in the right mid thoracic paraspinals and out to the ribs    Thoracic ROM:   AROM eval  Flexion painful  Extension Painful past neutral   Right lateral flexion   Left lateral flexion   Right rotation Painful   Left rotation Painful   (Blank rows = not tested) UPPER EXTREMITY ROM:  Active ROM Right eval Left eval  Shoulder flexion Painful in the back    Shoulder extension    Shoulder abduction    Shoulder adduction    Shoulder extension    Shoulder internal rotation    Shoulder external rotation    Elbow flexion    Elbow extension    Wrist flexion    Wrist extension    Wrist ulnar deviation    Wrist radial deviation    Wrist pronation    Wrist supination     (Blank rows = not tested)   LOWER EXTREMITY MMT:  MMT Right eval Left eval  Hip flexion Painful in the back    Hip extension    Hip abduction    Hip adduction    Hip internal rotation    Hip external rotation    Knee flexion    Knee extension    Ankle dorsiflexion    Ankle plantarflexion    Ankle inversion    Ankle eversion     (Blank rows = not tested)  GAIT: No abnormalities  TODAY'S TREATMENT:                                                                                                                              DATE: thoracic extension to neutral Trigger point release to mid thoracic spine.   1/8 Manual: Have patient lie in side-lying.  Trigger point release to thoracic paraspinals.  Gentle grade 1 and 2 PA mobilization of mid thoracic spine.   Seated high ball roll on table 10-second holds at endrange 5 times  Seated diagonal ball roll out to the left for right stretch 10-second hold 5 times  Standing foam roll lengthwise along spine pulling to neutral x 10 Bilateral shoulder flexion x 10  Scap retraction x 15 green Shoulder extension x 15 green   PATIENT EDUCATION:  Education details: HEP, symptom management, anatomy of the condition  Person educated: Patient Education method: Explanation, Demonstration, Tactile cues, Verbal cues, and Handouts Education comprehension: verbalized understanding, returned demonstration, verbal cues required, tactile cues required, and needs further education  HOME EXERCISE PROGRAM: Thoracic extension to neutral   ASSESSMENT:  CLINICAL IMPRESSION:  The patient had no significant increase in pain with treatment.  He did still continue to have pain with certain movements.  He was advised if he can to increase the amplitude of his thoracic extensions.  He is given an updated HEP with current exercises that he is able to perform today.  We will continue to progress as tolerated.  OBJECTIVE IMPAIRMENTS: decreased activity tolerance, decreased ROM, decreased strength, and  pain.   ACTIVITY LIMITATIONS: carrying, lifting, bending, bed mobility, and reach over head  PARTICIPATION LIMITATIONS: meal prep, cleaning, laundry, driving, shopping, community activity, yard work, and golf  PERSONAL FACTORS: 1-2 comorbidities: abnormal clotting factor;   are also affecting patient's functional outcome.   REHAB POTENTIAL: Good  CLINICAL DECISION MAKING: Evolving/moderate complexity increasing pain with movement   EVALUATION COMPLEXITY: Moderate   GOALS: Goals reviewed with patient? Yes  SHORT TERM GOALS: Target date: 03/26/2022    Patient will flex shoulders overhead with a 50% reduction in thoracic pain Baseline: Goal status: INITIAL  2.  Patient will extend past neutral with thoracic spine with a 50% reduction in pain Baseline:  Goal status: INITIAL  3.  Patient will lie prone without a significant increase in pain Baseline:  Goal status: INITIAL  LONG TERM GOALS: Target date: 04/16/2022    Patient will demonstrate full thoracic movement without increased pain in order  to perform IADLs Baseline:  Goal status: INITIAL  2.  Patient will return to golf without increased pain Baseline:  Goal status: INITIAL  3.  Patient will be independent with full exercise program to improve core strength and decrease likelihood of reoccurrence of issue Baseline:  Goal status: INITIAL  PLAN:  PT FREQUENCY: 1-2x/week  PT DURATION: 6 weeks  PLANNED INTERVENTIONS: Therapeutic exercises, Therapeutic activity, Neuromuscular re-education, Patient/Family education, Self Care, Joint mobilization, Aquatic Therapy, Dry Needling, Spinal mobilization, Taping, Ultrasound, and Manual therapy.  PLAN FOR NEXT SESSION: Review tolerance to thoracic extension, begin light core strengthening as tolerated.  Consider supine wand flexion in pain-free range.  Assess general mobility of thoracic spine.   Carney Living, PT 03/09/2022, 9:13 PM

## 2022-03-17 NOTE — Therapy (Addendum)
OUTPATIENT PHYSICAL THERAPY THORACOLUMBAR EVALUATION   Patient Name: Dean Knox MRN: [id number] DOB:11-30-1960, 62 y.o., male Today's Date: 03/19/2022  END OF SESSION:  PT End of Session - 03/18/22 1108     Visit Number 3    Number of Visits 12    Date for PT Re-Evaluation 04/16/22    PT Start Time 1025    PT Stop Time 1104    PT Time Calculation (min) 39 min    Activity Tolerance Patient tolerated treatment well    Behavior During Therapy [facility] for tasks assessed/performed             Past Medical History:  Diagnosis Date   Allergy [id number]   Anxiety 2003   PTSD   Arthritis 2003   Bilevel positive airway pressure (BPAP) dependence 03/26/2021   Blood glucose abnormal 03/26/2021   Cancer ([facility]) 2014   Skin. Melanoma   Chronic post-traumatic stress disorder (PTSD)    Clotting disorder ([facility]) 2017   Missing clotting factor   Congestive heart failure ([facility])    Cysts of right lower eyelid 03/26/2021   Depression 2003   Edema 03/26/2021   Embolism and thrombosis of artery ([facility]) 03/26/2021   Enchondroma of left femur 01/16/2020   Hyperlipidemia 2000   Hypersomnia with sleep apnea 03/26/2021   Hypertension    Keratitis 03/26/2021   Neck muscle spasm    Other chronic allergic conjunctivitis 03/26/2021   Est'd dx, not well controlled.  Patanol not working.  I wrote a script for Zaditor to replace Patanol.  f/u in one week.   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 No pertinent surgical history. Patient Active Problem List   Diagnosis Date Noted   Diabetes mellitus ([facility]) 02/27/2022   Back pain 02/19/2022   Nonocclusive coronary atherosclerosis of native coronary artery 09/23/2021   Heterozygous familial hypercholesterolemia 09/23/2021   Male erectile disorder (CODE)  05/12/2021   Peripheral neuropathy 05/12/2021   History of melanoma in situ 03/26/2021   Peripheral venous insufficiency 03/26/2021   Actinic keratosis 03/26/2021   Adenomatous polyp of colon 03/26/2021   Allergic rhinitis due to pollen 03/26/2021   Attention deficit hyperactivity disorder, predominantly inattentive type 03/26/2021   Essential hypertension 03/26/2021   Congestive heart failure ([facility]) 03/26/2021   Diverticulitis of large intestine without perforation or abscess without bleeding 03/26/2021   Fatty liver 03/26/2021   Chronic post-traumatic stress disorder (PTSD) 03/26/2021   History of deep vein thrombosis 03/26/2021   History of malignant melanoma 03/26/2021   History of osteoarthritis 03/26/2021   Insomnia 03/26/2021   Lateral epicondylitis, right elbow 03/26/2021   Migraine 03/26/2021   Hyperlipidemia 03/26/2021   Body mass index (BMI) of 36.0-36.9 in adult 03/26/2021   Obstructive sleep apnea 03/26/2021   Panic disorder 03/26/2021   Psoriasis vulgaris 03/26/2021   Recurrent major depression ([facility]) 03/26/2021   Seborrheic dermatitis 03/26/2021   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/26/2021   Osteoarthrosis, pelvic region and thigh 03/26/2021   Restless legs syndrome 03/26/2021   Umbilical hernia without obstruction or gangrene 03/26/2021   Nonallopathic lesion of cervical region 12/01/2019   Nonallopathic lesion of thoracic region 12/01/2019    PCP: Dr Raymond [place]   REFERRING PROVIDER: Dr Ceasar [place]   REFERRING DIAG:  Mid Back Pain    Rationale for Evaluation and Treatment: Rehabilitation  THERAPY DIAG:  Pain in thoracic spine  ONSET DATE: 3 weeks prior   SUBJECTIVE:                                                                                                                                                                                           SUBJECTIVE STATEMENT: Patient states his back is feeling a lot better.  His pain is much less,  but has soreness.  Pt denies any adverse effects after prior Rx and states he felt good after Rx.  Pt reports compliance with HEP.  Pt uses heat which feels good.  He states a hot shower feels good.    PERTINENT HISTORY:  On a blood thinner 2nd to clotting disorder; DVT ; remote history of neck muscle spasm   PAIN:  Are you having pain? Yes: NPRS scale: 3/10 with rotation, No pain at rest Pain location: right side mid to low thoracic Pain description: catches  Aggravating factors: certain movements shoot the pain level up  Relieving factors: rest   PRECAUTIONS: None  WEIGHT BEARING RESTRICTIONS: No  FALLS:  Has patient fallen in last 6 months? No  LIVING ENVIRONMENT:  OCCUPATION:  Retired   Presenter, broadcasting: golf and travel    PLOF: Independent  PATIENT GOALS:  To have less pain and return to golf   NEXT MD VISIT:   OBJECTIVE:   DIAGNOSTIC FINDINGS:  X-ray: (-)   PATIENT SURVEYS:  FOTO    SCREENING FOR RED FLAGS: Bowel or bladder incontinence: No Spinal tumors: No Cauda equina syndrome: No Compression fracture: No Abdominal aneurysm: No  COGNITION: Overall cognitive status: Within functional limits for tasks assessed     SENSATION: Denies Paresthesias   MUSCLE LENGTH:  POSTURE: No Significant postural limitations  PALPATION: Mild spasming and tenderness in the right mid thoracic paraspinals and out to the ribs    Thoracic ROM:   AROM eval  Flexion painful  Extension Painful past neutral   Right lateral flexion   Left lateral flexion   Right rotation Painful   Left rotation Painful   (Blank rows = not tested) UPPER EXTREMITY ROM:  Active ROM Right eval Left eval  Shoulder flexion Painful in the back    Shoulder extension    Shoulder abduction    Shoulder adduction    Shoulder extension    Shoulder internal rotation    Shoulder external rotation    Elbow flexion    Elbow extension    Wrist flexion    Wrist extension    Wrist ulnar  deviation  Wrist radial deviation    Wrist pronation    Wrist supination     (Blank rows = not tested)   LOWER EXTREMITY MMT:    MMT Right eval Left eval  Hip flexion Painful in the back    Hip extension    Hip abduction    Hip adduction    Hip internal rotation    Hip external rotation    Knee flexion    Knee extension    Ankle dorsiflexion    Ankle plantarflexion    Ankle inversion    Ankle eversion     (Blank rows = not tested)  GAIT: No abnormalities    TODAY'S TREATMENT:                                                                                                                               1/17  Pt does have pain with L thoracic rotation.   Manual Therapy:  STM to R sided thoracic paraspinals in L S/L'ing.  Gentle grade 1 and 2 PA mobilization of mid thoracic spine.    Therapeutic Exercise: Pt performed: Seated thoracic extension with back supported in chair 2x10 Seated high ball roll on table 10-second holds at endrange 2x5  Seated diagonal ball roll out to the left for right stretch 10-second 2x5 Rows with Scap retraction x 15 green Shoulder extension x 15 green Standing hz abduction with RTB 2x10 Standing foam roll lengthwise along spine with Bilateral shoulder flexion x 10   1/8 Manual: Have patient lie in side-lying.  Trigger point release to thoracic paraspinals.  Gentle grade 1 and 2 PA mobilization of mid thoracic spine.   Seated high ball roll on table 10-second holds at endrange 5 times  Seated diagonal ball roll out to the left for right stretch 10-second hold 5 times  Standing foam roll lengthwise along spine with Bilateral shoulder flexion x 10  Scap retraction x 15 green Shoulder extension x 15 green   PATIENT EDUCATION:  Education details: HEP, symptom management, anatomy of the condition, and exercise form.  Person educated: Patient Education method: Explanation, Demonstration, Tactile cues, Verbal cues, and  Handouts Education comprehension: verbalized understanding, returned demonstration, verbal cues required, tactile cues required, and needs further education  HOME EXERCISE PROGRAM: Thoracic extension to neutral   ASSESSMENT:  CLINICAL IMPRESSION:  Pt is progressing well with pain and sx's as evidenced by subjective reports.  He has improved mobility with reduced pain.  Pt performed exercises well with cuing and instruction in correct form.  Pt responded well to Rx reporting no increased pain and stated he feels a little better after Rx.  Pt should continue to benefit from cont skilled PT services to address ongoing goals and to restore PLOF.    OBJECTIVE IMPAIRMENTS: decreased activity tolerance, decreased ROM, decreased strength, and pain.   ACTIVITY LIMITATIONS: carrying, lifting, bending, bed mobility, and reach over head  PARTICIPATION LIMITATIONS: meal prep, cleaning, laundry,  driving, shopping, community activity, yard work, and golf  PERSONAL FACTORS: 1-2 comorbidities: abnormal clotting factor;   are also affecting patient's functional outcome.   REHAB POTENTIAL: Good  CLINICAL DECISION MAKING: Evolving/moderate complexity increasing pain with movement   EVALUATION COMPLEXITY: Moderate   GOALS: Goals reviewed with patient? Yes  SHORT TERM GOALS: Target date: 03/26/2022    Patient will flex shoulders overhead with a 50% reduction in thoracic pain Baseline: Goal status: INITIAL  2.  Patient will extend past neutral with thoracic spine with a 50% reduction in pain Baseline:  Goal status: INITIAL  3.  Patient will lie prone without a significant increase in pain Baseline:  Goal status: INITIAL  LONG TERM GOALS: Target date: 04/16/2022    Patient will demonstrate full thoracic movement without increased pain in order to perform IADLs Baseline:  Goal status: INITIAL  2.  Patient will return to golf without increased pain Baseline:  Goal status: INITIAL  3.   Patient will be independent with full exercise program to improve core strength and decrease likelihood of reoccurrence of issue Baseline:  Goal status: INITIAL  PLAN:  PT FREQUENCY: 1-2x/week  PT DURATION: 6 weeks  PLANNED INTERVENTIONS: Therapeutic exercises, Therapeutic activity, Neuromuscular re-education, Patient/Family education, Self Care, Joint mobilization, Aquatic Therapy, Dry Needling, Spinal mobilization, Taping, Ultrasound, and Manual therapy.  PLAN FOR NEXT SESSION: Review tolerance to thoracic extension, begin light core strengthening as tolerated.  Consider supine wand flexion in pain-free range.  Assess general mobility of thoracic spine.   Audie Clear III PT, DPT 03/19/22 4:27 PM  PHYSICAL THERAPY DISCHARGE SUMMARY  Visits from Start of Care: 3  Current functional level related to goals / functional outcomes: Unable to assess due to pt not being present at discharge.   Remaining deficits: Unable to assess   Education / Equipment: HEP   Patient was seen in PT from 03/05/22 - 03/18/2022.  Pt was progressing well with pain and sx's.  He cancelled his following 2 appointments after 1/17.  He will be discharged from skilled PT services due to cancelling appt's and not rescheduling.    Audie Clear III PT, DPT 04/05/23 10:23 AM

## 2022-03-18 ENCOUNTER — Ambulatory Visit (HOSPITAL_BASED_OUTPATIENT_CLINIC_OR_DEPARTMENT_OTHER): Admitting: Physical Therapy

## 2022-03-18 ENCOUNTER — Encounter (HOSPITAL_BASED_OUTPATIENT_CLINIC_OR_DEPARTMENT_OTHER): Payer: Self-pay | Admitting: Physical Therapy

## 2022-03-18 DIAGNOSIS — M546 Pain in thoracic spine: Secondary | ICD-10-CM | POA: Diagnosis not present

## 2022-03-25 ENCOUNTER — Encounter (HOSPITAL_BASED_OUTPATIENT_CLINIC_OR_DEPARTMENT_OTHER): Admitting: Physical Therapy

## 2022-03-26 ENCOUNTER — Ambulatory Visit (HOSPITAL_BASED_OUTPATIENT_CLINIC_OR_DEPARTMENT_OTHER)

## 2022-04-01 ENCOUNTER — Other Ambulatory Visit (HOSPITAL_BASED_OUTPATIENT_CLINIC_OR_DEPARTMENT_OTHER): Payer: Self-pay | Admitting: Nurse Practitioner

## 2022-04-01 DIAGNOSIS — F332 Major depressive disorder, recurrent severe without psychotic features: Secondary | ICD-10-CM

## 2022-04-01 DIAGNOSIS — F4001 Agoraphobia with panic disorder: Secondary | ICD-10-CM

## 2022-04-01 DIAGNOSIS — F5104 Psychophysiologic insomnia: Secondary | ICD-10-CM

## 2022-04-01 DIAGNOSIS — F900 Attention-deficit hyperactivity disorder, predominantly inattentive type: Secondary | ICD-10-CM

## 2022-04-01 DIAGNOSIS — F4312 Post-traumatic stress disorder, chronic: Secondary | ICD-10-CM

## 2022-04-06 ENCOUNTER — Telehealth (HOSPITAL_BASED_OUTPATIENT_CLINIC_OR_DEPARTMENT_OTHER): Payer: Self-pay

## 2022-04-06 ENCOUNTER — Other Ambulatory Visit: Payer: Self-pay | Admitting: Family Medicine

## 2022-04-06 DIAGNOSIS — F332 Major depressive disorder, recurrent severe without psychotic features: Secondary | ICD-10-CM

## 2022-04-06 DIAGNOSIS — F5104 Psychophysiologic insomnia: Secondary | ICD-10-CM

## 2022-04-06 DIAGNOSIS — F4312 Post-traumatic stress disorder, chronic: Secondary | ICD-10-CM

## 2022-04-06 DIAGNOSIS — F4001 Agoraphobia with panic disorder: Secondary | ICD-10-CM

## 2022-04-06 MED ORDER — ZOLPIDEM TARTRATE 5 MG PO TABS: ORAL_TABLET | ORAL | 0 refills | Status: DC

## 2022-04-06 NOTE — Telephone Encounter (Signed)
Pt called and requested a refill on his Zolpidem '5mg'$  tablets. He would like for it be refilled at the [facility] on file.  Thanks

## 2022-04-07 ENCOUNTER — Telehealth (HOSPITAL_BASED_OUTPATIENT_CLINIC_OR_DEPARTMENT_OTHER): Payer: Self-pay

## 2022-04-07 NOTE — Telephone Encounter (Signed)
Pt called and he requested Ambien, Adderall and Xanax. Provider requested office visit on these conditions have not been assessed by Dr. [place]. Last f/u on 04/2021 by Jacolyn Reedy, NP. Pt declines f/u on office visit.

## 2022-04-14 ENCOUNTER — Ambulatory Visit: Admitting: Nurse Practitioner

## 2022-04-14 ENCOUNTER — Encounter: Payer: Self-pay | Admitting: Nurse Practitioner

## 2022-04-14 VITALS — BP 130/80 | HR 68 | Ht 76.0 in | Wt 288.8 lb

## 2022-04-14 DIAGNOSIS — K5792 Diverticulitis of intestine, part unspecified, without perforation or abscess without bleeding: Secondary | ICD-10-CM

## 2022-04-14 DIAGNOSIS — I509 Heart failure, unspecified: Secondary | ICD-10-CM

## 2022-04-14 DIAGNOSIS — F5104 Psychophysiologic insomnia: Secondary | ICD-10-CM | POA: Diagnosis not present

## 2022-04-14 DIAGNOSIS — F900 Attention-deficit hyperactivity disorder, predominantly inattentive type: Secondary | ICD-10-CM

## 2022-04-14 DIAGNOSIS — Z86718 Personal history of other venous thrombosis and embolism: Secondary | ICD-10-CM

## 2022-04-14 DIAGNOSIS — F4312 Post-traumatic stress disorder, chronic: Secondary | ICD-10-CM

## 2022-04-14 DIAGNOSIS — R1031 Right lower quadrant pain: Secondary | ICD-10-CM

## 2022-04-14 DIAGNOSIS — F332 Major depressive disorder, recurrent severe without psychotic features: Secondary | ICD-10-CM

## 2022-04-14 DIAGNOSIS — I10 Essential (primary) hypertension: Secondary | ICD-10-CM

## 2022-04-14 DIAGNOSIS — G588 Other specified mononeuropathies: Secondary | ICD-10-CM

## 2022-04-14 DIAGNOSIS — E1159 Type 2 diabetes mellitus with other circulatory complications: Secondary | ICD-10-CM

## 2022-04-14 DIAGNOSIS — E559 Vitamin D deficiency, unspecified: Secondary | ICD-10-CM

## 2022-04-14 DIAGNOSIS — F4001 Agoraphobia with panic disorder: Secondary | ICD-10-CM | POA: Diagnosis not present

## 2022-04-14 DIAGNOSIS — E1165 Type 2 diabetes mellitus with hyperglycemia: Secondary | ICD-10-CM

## 2022-04-14 DIAGNOSIS — E785 Hyperlipidemia, unspecified: Secondary | ICD-10-CM

## 2022-04-14 MED ORDER — TRAZODONE HCL 100 MG PO TABS: 100.0000 mg | ORAL_TABLET | Freq: Every day | ORAL | 3 refills | Status: DC

## 2022-04-14 MED ORDER — XARELTO 20 MG PO TABS: 20.0000 mg | ORAL_TABLET | Freq: Every morning | ORAL | 3 refills | Status: AC

## 2022-04-14 MED ORDER — ALPRAZOLAM 0.5 MG PO TABS: 0.5000 mg | ORAL_TABLET | Freq: Two times a day (BID) | ORAL | 1 refills | Status: DC | PRN

## 2022-04-14 MED ORDER — AMPHETAMINE-DEXTROAMPHET ER 30 MG PO CP24: 30.0000 mg | ORAL_CAPSULE | ORAL | 0 refills | Status: DC

## 2022-04-14 MED ORDER — ZOLPIDEM TARTRATE 5 MG PO TABS: ORAL_TABLET | ORAL | 2 refills | Status: DC

## 2022-04-14 NOTE — Assessment & Plan Note (Signed)
Patient is responding positively to the current medication regimen for ADHD. He is tolerating the current dose well. He admits to still having some difficulty getting his thoughts in order, but overall this is improved.  Plan:  Approve refill of Adderall XR 30 mg with a schedule of four prescriptions, each refill spaced 28 days apart. Follow-up in 4 months.

## 2022-04-14 NOTE — Progress Notes (Signed)
Orma Render, DNP, AGNP-c [place], [place] [address] [phone]  Subjective:   Dean Knox is a 62 y.o. male presents to day for evaluation of:  Primary concern being the need for medication refills, specifically Adderall, Xanax, and Ambien. He indicates that his supply of Adderall is running low and expresses a desire to have a 90-day prescription prepared at the pharmacy, with the option to pick it up in 30-day increments.  Additionally, he reports a history of diverticulitis and describes experiencing a dull, gnawing pain in the affected area (RLQ) for the past three to four weeks. He notes that the pain is intermittent and sometimes radiates to his back. Due to these symptoms, he expresses concern about potential underlying issues that may have been overlooked in previous assessments and requests a scan to rule out any serious conditions.  He also expresses the need to have his laboratory tests updated, as he thinks it has been more than a year since his last evaluation. He is particularly interested in monitoring his cholesterol levels and blood sugar, citing a history of borderline diabetes. He mentions that his highest A1c was 6.8, which subsequently improved to 6.2.  In terms of medication, he confirms that he is currently taking Xarelto, Losartan 100 mg, Trazodone, Ambien, and Adderall. He reports that his ADHD symptoms are well-controlled with the current dosage of Adderall. Furthermore, he discusses experiencing neuropathy in three toes, which he suspects may be linked to a prior back injury rather than diabetes. Although he has been prescribed Gabapentin in the past for this issue, he indicates a preference to avoid additional medications if possible.  PMH, Medications, and Allergies reviewed and updated in chart as appropriate.   ROS negative except for what is listed in HPI. Objective:  BP 130/80   Pulse 68   Ht 6' 4"$   (1.93 m)   Wt 288 lb 12.8 oz (131 kg)   BMI 35.15 kg/m  Physical Exam Vitals and nursing note reviewed.  Constitutional:      Appearance: Normal appearance.  HENT:     Head: Normocephalic.  Eyes:     Pupils: Pupils are equal, round, and reactive to light.  Cardiovascular:     Rate and Rhythm: Normal rate and regular rhythm.     Pulses: Normal pulses.     Heart sounds: Normal heart sounds.  Pulmonary:     Effort: Pulmonary effort is normal.     Breath sounds: Normal breath sounds.  Abdominal:     General: Bowel sounds are normal. There is distension.     Tenderness: There is abdominal tenderness. There is no right CVA tenderness, left CVA tenderness or rebound.     Comments: Right sided abdominal swelling and tenderness present.  Radiation around to the back on the right side.   Musculoskeletal:     Cervical back: Normal range of motion.     Right lower leg: No edema.     Left lower leg: No edema.  Skin:    General: Skin is warm and dry.     Capillary Refill: Capillary refill takes less than 2 seconds.  Neurological:     General: No focal deficit present.     Mental Status: He is alert and oriented to person, place, and time.     Motor: No weakness.  Psychiatric:        Mood and Affect: Mood normal.           Assessment & Plan:  Problem List Items Addressed This Visit     Attention deficit hyperactivity disorder, predominantly inattentive type - Primary    Patient is responding positively to the current medication regimen for ADHD. He is tolerating the current dose well. He admits to still having some difficulty getting his thoughts in order, but overall this is improved.  Plan:  Approve refill of Adderall XR 30 mg with a schedule of four prescriptions, each refill spaced 28 days apart. Follow-up in 4 months.        Relevant Medications   amphetamine-dextroamphetamine (ADDERALL XR) 30 MG 24 hr capsule (Start on 07/07/2022)   ALPRAZolam (XANAX) 0.5 MG tablet    amphetamine-dextroamphetamine (ADDERALL XR) 30 MG 24 hr capsule   amphetamine-dextroamphetamine (ADDERALL XR) 30 MG 24 hr capsule (Start on 05/12/2022)   amphetamine-dextroamphetamine (ADDERALL XR) 30 MG 24 hr capsule (Start on 06/09/2022)   Essential hypertension    Patient reports effective management of hypertension with current medication. He is not having any side effects. His blood pressure is controlled today.  Plan:  Maintain the current dosage of Losartan 100 mg daily to continue managing blood pressure. Return for labs in the next day or so when fasting.       Relevant Medications   XARELTO 20 MG TABS tablet   Other Relevant Orders   CBC with Differential/Platelet   Comprehensive metabolic panel   Hemoglobin A1c   Lipid panel   VITAMIN D 25 Hydroxy (Vit-D Deficiency, Fractures)   POCT UA - Microalbumin   Chronic post-traumatic stress disorder (PTSD)    Patient has requested a refill of their as-needed anxiety medication. He is not taking his xanax daily, but very intermittently. At this time his anxiety symptoms are well controlled. He feels the current dose is effective.  Plan:  Refill Xanax PRN to manage anxiety symptoms as required.      Relevant Medications   ALPRAZolam (XANAX) 0.5 MG tablet   zolpidem (AMBIEN) 5 MG tablet   traZODone (DESYREL) 100 MG tablet   Recurrent major depression (HCC)    Symptoms stable at this time with no concerns. No changes to plan of care.       Relevant Medications   ALPRAZolam (XANAX) 0.5 MG tablet   zolpidem (AMBIEN) 5 MG tablet   traZODone (DESYREL) 100 MG tablet   Peripheral neuropathy    Patient reports symptoms of neuropathy in toes, which may be related to a prior back injury, but at this time it is unclear. Plan:  Continue to observe neuropathic symptoms and consider additional evaluation or medication, such as Gabapentin, if the condition escalates or becomes disruptive. I will order labs for B12 and TSH which could cause  numbness and tingling to occur if these levels are off.       Relevant Medications   amphetamine-dextroamphetamine (ADDERALL XR) 30 MG 24 hr capsule (Start on 07/07/2022)   ALPRAZolam (XANAX) 0.5 MG tablet   zolpidem (AMBIEN) 5 MG tablet   traZODone (DESYREL) 100 MG tablet   amphetamine-dextroamphetamine (ADDERALL XR) 30 MG 24 hr capsule   amphetamine-dextroamphetamine (ADDERALL XR) 30 MG 24 hr capsule (Start on 05/12/2022)   amphetamine-dextroamphetamine (ADDERALL XR) 30 MG 24 hr capsule (Start on 06/09/2022)   Other Relevant Orders   CBC with Differential/Platelet   Comprehensive metabolic panel   Hemoglobin A1c   Lipid panel   VITAMIN D 25 Hydroxy (Vit-D Deficiency, Fractures)   POCT UA - Microalbumin   TSH   B12 and Folate Panel   Type  2 diabetes mellitus with other circulatory complication, without long-term current use of insulin ([place])    Patient has a history of HbA1c levels consistent with diabetes, but he has been able to get these down with lifestyle changes. He is concerned about the risk of increasing levels and would like labs. Plan:  Order an HbA1c test to monitor blood sugar levels and evaluate the risk of diabetes. Come back in the next day or two for the labs.  Will need urine micro albumin for monitoring.       Relevant Medications   XARELTO 20 MG TABS tablet   Right lower quadrant abdominal pain    Patient experiences recurrent discomfort in the region associated with diagnosed diverticulitis. There is an aspect of radiation around to the back, which does pose concern for possible gall bladder/liver etiology. Examination shows that the right side of the abdomen is more firm and larger then the left. He is not having any alarm symptoms right now.  Plan:  Schedule a CT scan to investigate the current state of the diverticulitis and exclude other potential causes of the abdominal pain. Obtain labs for evaluation of liver function and white blood counts.       Relevant  Orders   CT Abdomen Pelvis Wo Contrast   CBC with Differential/Platelet   Comprehensive metabolic panel   Hemoglobin A1c   Lipid panel   VITAMIN D 25 Hydroxy (Vit-D Deficiency, Fractures)   POCT UA - Microalbumin   Hyperlipidemia   Relevant Medications   XARELTO 20 MG TABS tablet   Other Relevant Orders   CBC with Differential/Platelet   Comprehensive metabolic panel   Hemoglobin A1c   Lipid panel   VITAMIN D 25 Hydroxy (Vit-D Deficiency, Fractures)   POCT UA - Microalbumin   Congestive heart failure (HCC)   Relevant Medications   XARELTO 20 MG TABS tablet   Other Relevant Orders   CBC with Differential/Platelet   Comprehensive metabolic panel   Hemoglobin A1c   Lipid panel   VITAMIN D 25 Hydroxy (Vit-D Deficiency, Fractures)   POCT UA - Microalbumin   History of deep vein thrombosis   Relevant Medications   XARELTO 20 MG TABS tablet   Other Relevant Orders   CBC with Differential/Platelet   Comprehensive metabolic panel   Hemoglobin A1c   Lipid panel   VITAMIN D 25 Hydroxy (Vit-D Deficiency, Fractures)   POCT UA - Microalbumin   Insomnia   Relevant Medications   ALPRAZolam (XANAX) 0.5 MG tablet   zolpidem (AMBIEN) 5 MG tablet   traZODone (DESYREL) 100 MG tablet   Other Visit Diagnoses     Panic disorder with agoraphobia       Relevant Medications   ALPRAZolam (XANAX) 0.5 MG tablet   zolpidem (AMBIEN) 5 MG tablet   traZODone (DESYREL) 100 MG tablet   Diverticulitis       Relevant Orders   CT Abdomen Pelvis Wo Contrast   Vitamin D deficiency       Relevant Orders   VITAMIN D 25 Hydroxy (Vit-D Deficiency, Fractures)         Orma Render, DNP, AGNP-c 04/14/2022  12:48 PM    History, Medications, Surgery, SDOH, and Family History reviewed and updated as appropriate.

## 2022-04-14 NOTE — Assessment & Plan Note (Signed)
Patient has requested a refill of their as-needed anxiety medication. He is not taking his xanax daily, but very intermittently. At this time his anxiety symptoms are well controlled. He feels the current dose is effective.  Plan:  Refill Xanax PRN to manage anxiety symptoms as required.

## 2022-04-14 NOTE — Assessment & Plan Note (Signed)
Symptoms stable at this time with no concerns. No changes to plan of care.

## 2022-04-14 NOTE — Assessment & Plan Note (Signed)
Patient reports symptoms of neuropathy in toes, which may be related to a prior back injury, but at this time it is unclear. Plan:  Continue to observe neuropathic symptoms and consider additional evaluation or medication, such as Gabapentin, if the condition escalates or becomes disruptive. I will order labs for B12 and TSH which could cause numbness and tingling to occur if these levels are off.

## 2022-04-14 NOTE — Assessment & Plan Note (Signed)
Patient reports effective management of hypertension with current medication. He is not having any side effects. His blood pressure is controlled today.  Plan:  Maintain the current dosage of Losartan 100 mg daily to continue managing blood pressure. Return for labs in the next day or so when fasting.

## 2022-04-14 NOTE — Assessment & Plan Note (Signed)
Patient experiences recurrent discomfort in the region associated with diagnosed diverticulitis. There is an aspect of radiation around to the back, which does pose concern for possible gall bladder/liver etiology. Examination shows that the right side of the abdomen is more firm and larger then the left. He is not having any alarm symptoms right now.  Plan:  Schedule a CT scan to investigate the current state of the diverticulitis and exclude other potential causes of the abdominal pain. Obtain labs for evaluation of liver function and white blood counts.

## 2022-04-14 NOTE — Patient Instructions (Addendum)
I have sent the order for the CT for you. They will call you to schedule this.   I have sent a refill in on all of your medications.   I have sent in 4 scripts of the Adderall for you.   Come back for labs at your convenience. I will let you know what the labs and CT scan.

## 2022-04-14 NOTE — Assessment & Plan Note (Signed)
Patient has a history of HbA1c levels consistent with diabetes, but he has been able to get these down with lifestyle changes. He is concerned about the risk of increasing levels and would like labs. Plan:  Order an HbA1c test to monitor blood sugar levels and evaluate the risk of diabetes. Come back in the next day or two for the labs.  Will need urine micro albumin for monitoring.

## 2022-04-17 ENCOUNTER — Other Ambulatory Visit

## 2022-04-17 LAB — LIPID PANEL

## 2022-04-18 LAB — LIPID PANEL
Chol/HDL Ratio: 2.1 ratio (ref 0.0–5.0)
Cholesterol, Total: 113 mg/dL (ref 100–199)
HDL: 54 mg/dL (ref >39)
LDL Chol Calc (NIH): 47 mg/dL (ref 0–99)
Triglycerides: 51 mg/dL (ref 0–149)
VLDL Cholesterol Cal: 12 mg/dL (ref 5–40)

## 2022-04-18 LAB — CBC WITH DIFFERENTIAL/PLATELET
Basophils Absolute: 0.1 10*3/uL (ref 0.0–0.2)
Basos: 1 %
EOS (ABSOLUTE): 0.1 10*3/uL (ref 0.0–0.4)
Eos: 1 %
Hematocrit: 46.6 % (ref 37.5–51.0)
Hemoglobin: 16.1 g/dL (ref 13.0–17.7)
Immature Grans (Abs): 0 10*3/uL (ref 0.0–0.1)
Immature Granulocytes: 0 %
Lymphocytes Absolute: 2.1 10*3/uL (ref 0.7–3.1)
Lymphs: 31 %
MCH: 30.3 pg (ref 26.6–33.0)
MCHC: 34.5 g/dL (ref 31.5–35.7)
MCV: 88 fL (ref 79–97)
Monocytes Absolute: 0.6 10*3/uL (ref 0.1–0.9)
Monocytes: 9 %
Neutrophils Absolute: 3.8 10*3/uL (ref 1.4–7.0)
Neutrophils: 58 %
Platelets: 184 10*3/uL (ref 150–450)
RBC: 5.31 x10E6/uL (ref 4.14–5.80)
RDW: 11.6 % (ref 11.6–15.4)
WBC: 6.7 10*3/uL (ref 3.4–10.8)

## 2022-04-18 LAB — COMPREHENSIVE METABOLIC PANEL
ALT: 21 IU/L (ref 0–44)
AST: 14 IU/L (ref 0–40)
Albumin/Globulin Ratio: 2.3 — ABNORMAL HIGH (ref 1.2–2.2)
Albumin: 4.3 g/dL (ref 3.8–4.9)
Alkaline Phosphatase: 71 IU/L (ref 44–121)
BUN/Creatinine Ratio: 23 — ABNORMAL HIGH (ref 9–20)
BUN: 19 mg/dL (ref 6–24)
Bilirubin Total: 0.4 mg/dL (ref 0.0–1.2)
CO2: 25 mmol/L (ref 20–29)
Calcium: 9.3 mg/dL (ref 8.7–10.2)
Chloride: 102 mmol/L (ref 96–106)
Creatinine, Ser: 0.84 mg/dL (ref 0.76–1.27)
Globulin, Total: 1.9 g/dL (ref 1.5–4.5)
Glucose: 118 mg/dL — ABNORMAL HIGH (ref 70–99)
Potassium: 4.6 mmol/L (ref 3.5–5.2)
Sodium: 140 mmol/L (ref 134–144)
Total Protein: 6.2 g/dL (ref 6.0–8.5)
eGFR: 100 mL/min/{1.73_m2} (ref >59)

## 2022-04-18 LAB — B12 AND FOLATE PANEL
Folate: 9.1 ng/mL (ref >3.0)
Vitamin B-12: 408 pg/mL (ref 232–1245)

## 2022-04-18 LAB — TSH: TSH: 1.41 u[IU]/mL (ref 0.450–4.500)

## 2022-04-18 LAB — HEMOGLOBIN A1C
Est. average glucose Bld gHb Est-mCnc: 131 mg/dL
Hgb A1c MFr Bld: 6.2 % — ABNORMAL HIGH (ref 4.8–5.6)

## 2022-04-18 LAB — VITAMIN D 25 HYDROXY (VIT D DEFICIENCY, FRACTURES): Vit D, 25-Hydroxy: 22.6 ng/mL — ABNORMAL LOW (ref 30.0–100.0)

## 2022-04-21 MED ORDER — VITAMIN D (ERGOCALCIFEROL) 1.25 MG (50000 UNIT) PO CAPS: 50000.0000 [IU] | ORAL_CAPSULE | ORAL | 0 refills | Status: DC

## 2022-04-21 NOTE — Addendum Note (Signed)
Addended by: Chloeanne Poteet, Clarise Cruz E on: 04/21/2022 07:12 AM   Modules accepted: Orders

## 2022-04-22 LAB — HM DIABETES EYE EXAM

## 2022-04-28 ENCOUNTER — Encounter: Payer: Self-pay | Admitting: Nurse Practitioner

## 2022-04-30 ENCOUNTER — Ambulatory Visit (HOSPITAL_BASED_OUTPATIENT_CLINIC_OR_DEPARTMENT_OTHER): Admitting: Family Medicine

## 2022-05-12 ENCOUNTER — Encounter (HOSPITAL_BASED_OUTPATIENT_CLINIC_OR_DEPARTMENT_OTHER): Payer: Self-pay

## 2022-05-13 ENCOUNTER — Ambulatory Visit
Admission: RE | Admit: 2022-05-13 | Discharge: 2022-05-13 | Disposition: A | Discharge status: Home / Self Care | Source: Ambulatory Visit | Attending: Nurse Practitioner | Admitting: Nurse Practitioner

## 2022-05-13 DIAGNOSIS — R1031 Right lower quadrant pain: Secondary | ICD-10-CM

## 2022-05-13 DIAGNOSIS — K5792 Diverticulitis of intestine, part unspecified, without perforation or abscess without bleeding: Secondary | ICD-10-CM

## 2022-05-13 NOTE — Telephone Encounter (Signed)
Cholesterol numbers look fantastic and are all at goal! Given his coronary artery disease, guidelines recommend staying on cholesterol lowering medication. It helps to lower his cholesterol, stabilize plaque that is there, reduce risk of heart attack/stroke. Unfortunately if he stopped the medication his numbers would return to previous values (prior LDL 164, now 47 on medication).  Dean Dubonnet, NP

## 2022-05-14 NOTE — Telephone Encounter (Signed)
Follow up question 

## 2022-05-15 ENCOUNTER — Encounter: Payer: Self-pay | Admitting: Nurse Practitioner

## 2022-05-15 DIAGNOSIS — M999 Biomechanical lesion, unspecified: Secondary | ICD-10-CM

## 2022-05-15 DIAGNOSIS — G588 Other specified mononeuropathies: Secondary | ICD-10-CM

## 2022-05-15 DIAGNOSIS — G8929 Other chronic pain: Secondary | ICD-10-CM

## 2022-05-15 DIAGNOSIS — Z8739 Personal history of other diseases of the musculoskeletal system and connective tissue: Secondary | ICD-10-CM

## 2022-05-27 MED ORDER — PREGABALIN 25 MG PO CAPS: 25.0000 mg | ORAL_CAPSULE | Freq: Two times a day (BID) | ORAL | 1 refills | Status: DC

## 2022-05-27 NOTE — Addendum Note (Signed)
Addended by: Sharlena Kristensen, Clarise Cruz E on: 05/27/2022 06:36 PM   Modules accepted: Orders

## 2022-06-03 ENCOUNTER — Telehealth: Payer: Self-pay

## 2022-06-03 NOTE — Telephone Encounter (Signed)
Pt called and advised his pharmacy said adderall xr is on backorder. They have it available as instant release. He will be going out of town on Friday for 10 days and is requesting a short supply of this to be filled so that he has it while he is out of town.

## 2022-06-04 ENCOUNTER — Other Ambulatory Visit: Payer: Self-pay | Admitting: Nurse Practitioner

## 2022-06-04 ENCOUNTER — Telehealth: Payer: Self-pay | Admitting: Family Medicine

## 2022-06-04 DIAGNOSIS — F900 Attention-deficit hyperactivity disorder, predominantly inattentive type: Secondary | ICD-10-CM

## 2022-06-04 MED ORDER — AMPHETAMINE-DEXTROAMPHETAMINE 30 MG PO TABS: 30.0000 mg | ORAL_TABLET | Freq: Two times a day (BID) | ORAL | 0 refills | Status: DC

## 2022-06-04 NOTE — Telephone Encounter (Signed)
Pt called again as going out of town and wants Adderall instant at [facility] in [place].  I promised I would call him shortly.

## 2022-06-17 NOTE — Telephone Encounter (Signed)
[facility] Dec sent in

## 2022-07-08 ENCOUNTER — Other Ambulatory Visit

## 2022-07-09 LAB — VITAMIN D 25 HYDROXY (VIT D DEFICIENCY, FRACTURES): Vit D, 25-Hydroxy: 52.7 ng/mL (ref 30.0–100.0)

## 2022-07-13 ENCOUNTER — Other Ambulatory Visit: Payer: Self-pay | Admitting: Nurse Practitioner

## 2022-07-13 DIAGNOSIS — E559 Vitamin D deficiency, unspecified: Secondary | ICD-10-CM

## 2022-07-14 ENCOUNTER — Other Ambulatory Visit: Payer: Self-pay

## 2022-07-14 DIAGNOSIS — E7801 Familial hypercholesterolemia: Secondary | ICD-10-CM

## 2022-07-14 DIAGNOSIS — I2510 Atherosclerotic heart disease of native coronary artery without angina pectoris: Secondary | ICD-10-CM

## 2022-07-14 MED ORDER — ROSUVASTATIN CALCIUM 20 MG PO TABS: 20.0000 mg | ORAL_TABLET | Freq: Every day | ORAL | 0 refills | Status: DC

## 2022-07-15 ENCOUNTER — Other Ambulatory Visit: Payer: Self-pay | Admitting: Nurse Practitioner

## 2022-07-15 DIAGNOSIS — E559 Vitamin D deficiency, unspecified: Secondary | ICD-10-CM

## 2022-07-15 MED ORDER — VITAMIN D (ERGOCALCIFEROL) 1.25 MG (50000 UNIT) PO CAPS: 50000.0000 [IU] | ORAL_CAPSULE | ORAL | 3 refills | Status: DC

## 2022-07-29 ENCOUNTER — Encounter: Payer: Self-pay | Admitting: Nurse Practitioner

## 2022-07-29 ENCOUNTER — Ambulatory Visit: Admitting: Nurse Practitioner

## 2022-07-29 VITALS — BP 130/86 | HR 67 | Wt 290.4 lb

## 2022-07-29 DIAGNOSIS — F332 Major depressive disorder, recurrent severe without psychotic features: Secondary | ICD-10-CM

## 2022-07-29 DIAGNOSIS — F5104 Psychophysiologic insomnia: Secondary | ICD-10-CM | POA: Diagnosis not present

## 2022-07-29 DIAGNOSIS — R1031 Right lower quadrant pain: Secondary | ICD-10-CM

## 2022-07-29 DIAGNOSIS — F4312 Post-traumatic stress disorder, chronic: Secondary | ICD-10-CM

## 2022-07-29 DIAGNOSIS — F4001 Agoraphobia with panic disorder: Secondary | ICD-10-CM

## 2022-07-29 DIAGNOSIS — K5730 Diverticulosis of large intestine without perforation or abscess without bleeding: Secondary | ICD-10-CM | POA: Diagnosis not present

## 2022-07-29 DIAGNOSIS — F900 Attention-deficit hyperactivity disorder, predominantly inattentive type: Secondary | ICD-10-CM | POA: Diagnosis not present

## 2022-07-29 MED ORDER — AMPHETAMINE-DEXTROAMPHET ER 30 MG PO CP24: 30.0000 mg | ORAL_CAPSULE | ORAL | 0 refills | Status: DC

## 2022-07-29 MED ORDER — ZOLPIDEM TARTRATE 5 MG PO TABS: ORAL_TABLET | ORAL | 5 refills | Status: DC

## 2022-07-29 NOTE — Assessment & Plan Note (Signed)
Chronic tenderness and discomfort of the RLQ with a history of diverticulitis. I reviewed the CT images with the patient today in the office and it appears that there may have been a mistake when notation was made for the report. There is an arrow marker present to highlight diverticula on the right lower quadrant on the imaging, however, this is listed as left lower quadrant on the report. I discussed this finding with the patient. The area of the marking is consistent with the patients area of pain, leading me to strongly believe that this is the cause of his symptoms. I will reach out to the imaging department to request a re-read of the CT scan for further clarification. I will also send a referral to GI for further evaluation, recommendations, and management of the symptoms given that this is ongoing.

## 2022-07-29 NOTE — Progress Notes (Signed)
Shawna Clamp, DNP, AGNP-c [facility] Medicine  [address] [place], [place] [address] [phone]  ESTABLISHED PATIENT- Chronic Health and/or Follow-Up Visit  Blood pressure 130/86, pulse 67, weight 290 lb 6.4 oz (131.7 kg).    Dean Knox is a 62 y.o. year old male presenting today for evaluation and management of abdominal pain.  Dean Knox has a known history of diverticulosis identified on a previous CT scan.  He has concerns today with ongoing right lower quadrant abdominal pain in the opposite location described on the CT.  Dean Knox describes a physical sensation in his abdomen, like getting to a "ridge" or "puff" that becomes more pronounced in certain positions, though he is unsure if it worsens when he flexes his stomach.  He describes the pain as constant and dull with some radiation towards the back.  He is not sure if back pain is musculoskeletal or related.  He also reports a sensation of a "split" in the middle of his abdomen with abdominal flexion.  He denies any pain but he does find this irritating.  At this time he is not having any acute abdominal symptoms however he would like to stay ahead of this and is concerned about the cause of the right lower quadrant pain.  All ROS negative with exception of what is listed above.   PHYSICAL EXAM Physical Exam Vitals and nursing note reviewed.  Constitutional:      General: He is not in acute distress.    Appearance: Normal appearance. He is obese. He is not ill-appearing.  HENT:     Head: Normocephalic.  Eyes:     Conjunctiva/sclera: Conjunctivae normal.  Cardiovascular:     Pulses: Normal pulses.  Pulmonary:     Effort: Pulmonary effort is normal.  Abdominal:     General: Bowel sounds are normal. There is no distension.     Palpations: Abdomen is soft. There is no mass.     Tenderness: There is abdominal tenderness. There is no right CVA tenderness, left CVA tenderness, guarding or rebound.      Comments: No definitive herniation, but diastasis recti is present at the midline superior and inferior to the umbilicus.   Tenderness in the RLQ with palpation with no palpable mass.   Musculoskeletal:        General: Normal range of motion.     Right lower leg: No edema.     Left lower leg: No edema.  Skin:    General: Skin is warm and dry.     Capillary Refill: Capillary refill takes less than 2 seconds.  Neurological:     General: No focal deficit present.     Mental Status: He is alert and oriented to person, place, and time.  Psychiatric:        Mood and Affect: Mood normal.     PLAN Problem List Items Addressed This Visit     Right lower quadrant abdominal pain    Chronic tenderness and discomfort of the RLQ with a history of diverticulitis. I reviewed the CT images with the patient today in the office and it appears that there may have been a mistake when notation was made for the report. There is an arrow marker present to highlight diverticula on the right lower quadrant on the imaging, however, this is listed as left lower quadrant on the report. I discussed this finding with the patient. The area of the marking is consistent with the patients area of pain, leading me to strongly believe  that this is the cause of his symptoms. I will reach out to the imaging department to request a re-read of the CT scan for further clarification. I will also send a referral to GI for further evaluation, recommendations, and management of the symptoms given that this is ongoing.       Attention deficit hyperactivity disorder, predominantly inattentive type   Relevant Medications   amphetamine-dextroamphetamine (ADDERALL XR) 30 MG 24 hr capsule   amphetamine-dextroamphetamine (ADDERALL XR) 30 MG 24 hr capsule (Start on 08/26/2022)   amphetamine-dextroamphetamine (ADDERALL XR) 30 MG 24 hr capsule (Start on 09/23/2022)   amphetamine-dextroamphetamine (ADDERALL XR) 30 MG 24 hr capsule (Start on  10/11/2022)   Chronic post-traumatic stress disorder (PTSD)   Relevant Medications   zolpidem (AMBIEN) 5 MG tablet   Recurrent major depression (HCC)   Relevant Medications   zolpidem (AMBIEN) 5 MG tablet   Insomnia   Relevant Medications   zolpidem (AMBIEN) 5 MG tablet   Other Visit Diagnoses     Diverticulosis of colon    -  Primary   Relevant Orders   Ambulatory referral to Gastroenterology   Panic disorder with agoraphobia       Relevant Medications   zolpidem (AMBIEN) 5 MG tablet       Return if symptoms worsen or fail to improve.   Shawna Clamp, DNP, AGNP-c 07/29/2022  9:04 AM

## 2022-08-03 ENCOUNTER — Telehealth: Payer: Self-pay | Admitting: Family Medicine

## 2022-08-03 NOTE — Telephone Encounter (Signed)
Spoke with Gabe in the Radiology Room at [facility] imaging [phone] requested re read of CT scan.  He states Lorelle Gibbs read it first time and he is no longer with them.  Liz Beach will have one of the other Rad to look at it.  I gave him my cell to call back if needed.

## 2022-08-17 ENCOUNTER — Telehealth: Payer: Self-pay | Admitting: Family Medicine

## 2022-08-17 NOTE — Telephone Encounter (Signed)
Called Gabe to see where the RE-READ is.  He said Lorelle Gibbs re-read on  6/4 at 1pm.  He said Lorelle Gibbs is helping out some. He will fax over.

## 2022-08-17 NOTE — Telephone Encounter (Signed)
CT addendum in chart.

## 2022-10-13 ENCOUNTER — Other Ambulatory Visit: Payer: Self-pay | Admitting: Nurse Practitioner

## 2022-10-13 DIAGNOSIS — F4312 Post-traumatic stress disorder, chronic: Secondary | ICD-10-CM

## 2022-10-13 DIAGNOSIS — F900 Attention-deficit hyperactivity disorder, predominantly inattentive type: Secondary | ICD-10-CM

## 2022-10-13 DIAGNOSIS — F4001 Agoraphobia with panic disorder: Secondary | ICD-10-CM

## 2022-10-13 DIAGNOSIS — F332 Major depressive disorder, recurrent severe without psychotic features: Secondary | ICD-10-CM

## 2022-10-13 DIAGNOSIS — F5104 Psychophysiologic insomnia: Secondary | ICD-10-CM

## 2022-10-13 NOTE — Telephone Encounter (Signed)
Last apt 07/29/22

## 2022-12-21 ENCOUNTER — Ambulatory Visit: Admitting: Nurse Practitioner

## 2022-12-21 ENCOUNTER — Encounter: Payer: Self-pay | Admitting: Nurse Practitioner

## 2022-12-21 VITALS — BP 130/84 | HR 69 | Wt 301.3 lb

## 2022-12-21 DIAGNOSIS — F5104 Psychophysiologic insomnia: Secondary | ICD-10-CM

## 2022-12-21 DIAGNOSIS — F900 Attention-deficit hyperactivity disorder, predominantly inattentive type: Secondary | ICD-10-CM

## 2022-12-21 DIAGNOSIS — F332 Major depressive disorder, recurrent severe without psychotic features: Secondary | ICD-10-CM

## 2022-12-21 DIAGNOSIS — M546 Pain in thoracic spine: Secondary | ICD-10-CM

## 2022-12-21 DIAGNOSIS — F4312 Post-traumatic stress disorder, chronic: Secondary | ICD-10-CM

## 2022-12-21 DIAGNOSIS — F4001 Agoraphobia with panic disorder: Secondary | ICD-10-CM | POA: Diagnosis not present

## 2022-12-21 MED ORDER — METHYLPREDNISOLONE 4 MG PO TBPK: ORAL_TABLET | ORAL | 1 refills | Status: DC

## 2022-12-21 MED ORDER — AMPHETAMINE-DEXTROAMPHET ER 30 MG PO CP24: 30.0000 mg | ORAL_CAPSULE | ORAL | 0 refills | Status: DC

## 2022-12-21 MED ORDER — ZOLPIDEM TARTRATE 5 MG PO TABS: ORAL_TABLET | ORAL | 5 refills | Status: DC

## 2022-12-21 NOTE — Progress Notes (Signed)
[name], DNP, AGNP-c [facility] Medicine [address] [place], [place] [address] [phone]   ACUTE VISIT- ESTABLISHED PATIENT  Blood pressure 130/84, pulse 69, weight (!) 301 lb 4.8 oz (136.7 kg).  Subjective:  HPI Dean Knox is a 62 y.o. male presents to day for evaluation of acute concern(s).   History of Present Illness Dean Knox presents with worsening mid-back discomfort with a hx of chronic back pain. The pain is described as constant, with exacerbation during certain movements, particularly twisting and getting out of bed in the morning. The pain radiates bilaterally from the spine and is most severe in the morning, improving slightly throughout the day. The patient denies any numbness or urinary symptoms.  The patient has sought multiple treatments for this issue, including physical therapy and various medications, but reports minimal relief. He has also had an X-ray in the past, the results of which are not discussed in this consultation. The patient has tried Lyrica and Flexeril with no noticeable change in symptoms. A steroid burst was previously effective in providing some relief, and the patient is open to trying this again.  The patient's lifestyle includes regular golfing, which surprisingly does not exacerbate the pain. The patient denies any tingling in the hands. The pain is impacting the patient's daily activities and causing significant frustration. The patient is interested in exploring other treatment options, including seeing a spine specialist and possibly trying chiropractic care.  ROS negative except for what is listed in HPI. History, Medications, Surgery, SDOH, and Family History reviewed and updated as appropriate.  Objective:  Physical Exam Vitals and nursing note reviewed.  Constitutional:      General: He is not in acute distress.    Appearance: Normal appearance. He is not ill-appearing.  Eyes:     Conjunctiva/sclera:  Conjunctivae normal.  Cardiovascular:     Rate and Rhythm: Normal rate and regular rhythm.     Pulses: Normal pulses.     Heart sounds: Normal heart sounds.  Pulmonary:     Effort: Pulmonary effort is normal.     Breath sounds: Normal breath sounds.  Musculoskeletal:        General: Tenderness present.     Cervical back: Normal range of motion. No tenderness.     Right lower leg: No edema.     Left lower leg: No edema.     Comments: ROM limited with rotation and flexion both laterally and ant/post. Pain concentrated in the thoracic region.   Skin:    General: Skin is warm and dry.     Capillary Refill: Capillary refill takes less than 2 seconds.  Neurological:     General: No focal deficit present.     Mental Status: He is alert and oriented to person, place, and time.     Sensory: No sensory deficit.     Motor: No weakness.     Gait: Gait abnormal.          Assessment & Plan:   Problem List Items Addressed This Visit     Panic disorder with agoraphobia   Relevant Medications   zolpidem (AMBIEN) 5 MG tablet   Back pain - Primary    Chronic, worsening pain in the mid back, exacerbated by certain movements. No relief from physical therapy or medications. No neurological symptoms. Previous imaging unknown. -Referral to a spinal specialist for further evaluation and possible interventions such as injections. -Prescribe Medrol dose pack with a refill to reduce inflammation and provide temporary relief.  Relevant Medications   methylPREDNISolone (MEDROL DOSEPAK) 4 MG TBPK tablet   Other Relevant Orders   Ambulatory referral to Spine Surgery   Attention deficit hyperactivity disorder, predominantly inattentive type   Relevant Medications   amphetamine-dextroamphetamine (ADDERALL XR) 30 MG 24 hr capsule   amphetamine-dextroamphetamine (ADDERALL XR) 30 MG 24 hr capsule (Start on 01/18/2023)   amphetamine-dextroamphetamine (ADDERALL XR) 30 MG 24 hr capsule (Start on  02/15/2023)   Chronic post-traumatic stress disorder (PTSD)   Relevant Medications   zolpidem (AMBIEN) 5 MG tablet   Recurrent major depression (HCC)   Relevant Medications   zolpidem (AMBIEN) 5 MG tablet   Insomnia   Relevant Medications   zolpidem (AMBIEN) 5 MG tablet    refills provided for chronic conditions  [name], DNP, AGNP-c

## 2022-12-29 NOTE — Assessment & Plan Note (Signed)
Chronic, worsening pain in the mid back, exacerbated by certain movements. No relief from physical therapy or medications. No neurological symptoms. Previous imaging unknown. -Referral to a spinal specialist for further evaluation and possible interventions such as injections. -Prescribe Medrol dose pack with a refill to reduce inflammation and provide temporary relief.

## 2023-01-07 ENCOUNTER — Other Ambulatory Visit: Payer: Self-pay | Admitting: Neurosurgery

## 2023-01-07 DIAGNOSIS — M5414 Radiculopathy, thoracic region: Secondary | ICD-10-CM

## 2023-01-07 DIAGNOSIS — M5416 Radiculopathy, lumbar region: Secondary | ICD-10-CM

## 2023-01-13 ENCOUNTER — Ambulatory Visit (HOSPITAL_BASED_OUTPATIENT_CLINIC_OR_DEPARTMENT_OTHER)
Admission: RE | Admit: 2023-01-13 | Discharge: 2023-01-13 | Disposition: A | Discharge status: Home / Self Care | Source: Ambulatory Visit | Attending: Neurosurgery | Admitting: Neurosurgery

## 2023-01-13 DIAGNOSIS — M5414 Radiculopathy, thoracic region: Secondary | ICD-10-CM | POA: Diagnosis present

## 2023-01-13 DIAGNOSIS — M5416 Radiculopathy, lumbar region: Secondary | ICD-10-CM | POA: Insufficient documentation

## 2023-01-30 ENCOUNTER — Other Ambulatory Visit

## 2023-02-23 ENCOUNTER — Observation Stay (HOSPITAL_BASED_OUTPATIENT_CLINIC_OR_DEPARTMENT_OTHER)
Admission: EM | Admit: 2023-02-23 | Discharge: 2023-02-24 | Disposition: A | Discharge status: Home / Self Care | Attending: Internal Medicine | Admitting: Internal Medicine

## 2023-02-23 ENCOUNTER — Encounter (HOSPITAL_BASED_OUTPATIENT_CLINIC_OR_DEPARTMENT_OTHER): Payer: Self-pay

## 2023-02-23 ENCOUNTER — Other Ambulatory Visit: Payer: Self-pay

## 2023-02-23 ENCOUNTER — Emergency Department (HOSPITAL_BASED_OUTPATIENT_CLINIC_OR_DEPARTMENT_OTHER)

## 2023-02-23 DIAGNOSIS — F909 Attention-deficit hyperactivity disorder, unspecified type: Secondary | ICD-10-CM | POA: Diagnosis present

## 2023-02-23 DIAGNOSIS — F900 Attention-deficit hyperactivity disorder, predominantly inattentive type: Secondary | ICD-10-CM | POA: Diagnosis not present

## 2023-02-23 DIAGNOSIS — G4700 Insomnia, unspecified: Secondary | ICD-10-CM | POA: Diagnosis present

## 2023-02-23 DIAGNOSIS — E119 Type 2 diabetes mellitus without complications: Secondary | ICD-10-CM

## 2023-02-23 DIAGNOSIS — I160 Hypertensive urgency: Secondary | ICD-10-CM | POA: Diagnosis not present

## 2023-02-23 DIAGNOSIS — Z86718 Personal history of other venous thrombosis and embolism: Secondary | ICD-10-CM | POA: Diagnosis not present

## 2023-02-23 DIAGNOSIS — I110 Hypertensive heart disease with heart failure: Secondary | ICD-10-CM | POA: Diagnosis not present

## 2023-02-23 DIAGNOSIS — Z85828 Personal history of other malignant neoplasm of skin: Secondary | ICD-10-CM | POA: Diagnosis not present

## 2023-02-23 DIAGNOSIS — Z79899 Other long term (current) drug therapy: Secondary | ICD-10-CM | POA: Diagnosis not present

## 2023-02-23 DIAGNOSIS — I10 Essential (primary) hypertension: Secondary | ICD-10-CM | POA: Diagnosis present

## 2023-02-23 DIAGNOSIS — I4891 Unspecified atrial fibrillation: Secondary | ICD-10-CM | POA: Diagnosis not present

## 2023-02-23 DIAGNOSIS — Z8679 Personal history of other diseases of the circulatory system: Secondary | ICD-10-CM

## 2023-02-23 DIAGNOSIS — F902 Attention-deficit hyperactivity disorder, combined type: Secondary | ICD-10-CM | POA: Insufficient documentation

## 2023-02-23 DIAGNOSIS — I509 Heart failure, unspecified: Secondary | ICD-10-CM | POA: Diagnosis not present

## 2023-02-23 DIAGNOSIS — E785 Hyperlipidemia, unspecified: Secondary | ICD-10-CM | POA: Insufficient documentation

## 2023-02-23 DIAGNOSIS — Z87891 Personal history of nicotine dependence: Secondary | ICD-10-CM | POA: Diagnosis not present

## 2023-02-23 DIAGNOSIS — I2510 Atherosclerotic heart disease of native coronary artery without angina pectoris: Secondary | ICD-10-CM | POA: Insufficient documentation

## 2023-02-23 DIAGNOSIS — R009 Unspecified abnormalities of heart beat: Secondary | ICD-10-CM | POA: Diagnosis present

## 2023-02-23 LAB — BASIC METABOLIC PANEL
Anion gap: 11 (ref 5–15)
BUN: 19 mg/dL (ref 6–20)
CO2: 29 mmol/L (ref 22–32)
Calcium: 9.8 mg/dL (ref 8.9–10.3)
Chloride: 98 mmol/L (ref 98–111)
Creatinine, Ser: 1.1 mg/dL (ref 0.61–1.24)
GFR, Estimated: >60 mL/min (ref >60)
Glucose, Bld: 121 mg/dL — ABNORMAL HIGH (ref 70–99)
Potassium: 3.8 mmol/L (ref 3.5–5.1)
Sodium: 138 mmol/L (ref 135–145)

## 2023-02-23 LAB — MAGNESIUM: Magnesium: 2.2 mg/dL (ref 1.7–2.4)

## 2023-02-23 LAB — CBC
HCT: 50.6 % (ref 39.0–52.0)
Hemoglobin: 17.6 g/dL — ABNORMAL HIGH (ref 13.0–17.0)
MCH: 31 pg (ref 26.0–34.0)
MCHC: 34.8 g/dL (ref 30.0–36.0)
MCV: 89.2 fL (ref 80.0–100.0)
Platelets: 179 10*3/uL (ref 150–400)
RBC: 5.67 MIL/uL (ref 4.22–5.81)
RDW: 12.1 % (ref 11.5–15.5)
WBC: 9.8 10*3/uL (ref 4.0–10.5)
nRBC: 0 % (ref 0.0–0.2)

## 2023-02-23 LAB — APTT: aPTT: 30 s (ref 24–36)

## 2023-02-23 LAB — PROTIME-INR
INR: 1 (ref 0.8–1.2)
Prothrombin Time: 13.5 s (ref 11.4–15.2)

## 2023-02-23 MED ORDER — DILTIAZEM LOAD VIA INFUSION
20.0000 mg | Freq: Once | INTRAVENOUS | Status: AC
  Administered 2023-02-23: 20 mg via INTRAVENOUS
  Filled 2023-02-23: qty 20

## 2023-02-23 MED ORDER — DILTIAZEM HCL-DEXTROSE 125-5 MG/125ML-% IV SOLN (PREMIX)
5.0000 mg/h | INTRAVENOUS | Status: DC
  Administered 2023-02-23: 5 mg/h via INTRAVENOUS
  Filled 2023-02-23: qty 125

## 2023-02-23 MED ORDER — IOHEXOL 350 MG/ML SOLN
100.0000 mL | Freq: Once | INTRAVENOUS | Status: AC | PRN
  Administered 2023-02-23: 75 mL via INTRAVENOUS

## 2023-02-23 NOTE — ED Triage Notes (Signed)
Pt reports he is here today due to sudden onset of palpations with sob. Pt denies any chest pain. Pt reports he felt like he was having PVC's. Pt reports he checked his HR and it was 160's.Pt HR in triage is 170's. Pt denies any PMH. Pt A&O x4 on arrival and ambulatory.

## 2023-02-24 ENCOUNTER — Observation Stay (HOSPITAL_BASED_OUTPATIENT_CLINIC_OR_DEPARTMENT_OTHER)

## 2023-02-24 ENCOUNTER — Encounter (HOSPITAL_COMMUNITY): Payer: Self-pay | Admitting: Internal Medicine

## 2023-02-24 DIAGNOSIS — F909 Attention-deficit hyperactivity disorder, unspecified type: Secondary | ICD-10-CM | POA: Diagnosis not present

## 2023-02-24 DIAGNOSIS — I110 Hypertensive heart disease with heart failure: Secondary | ICD-10-CM | POA: Diagnosis not present

## 2023-02-24 DIAGNOSIS — Z87891 Personal history of nicotine dependence: Secondary | ICD-10-CM | POA: Diagnosis not present

## 2023-02-24 DIAGNOSIS — I509 Heart failure, unspecified: Secondary | ICD-10-CM | POA: Diagnosis not present

## 2023-02-24 DIAGNOSIS — E785 Hyperlipidemia, unspecified: Secondary | ICD-10-CM | POA: Diagnosis not present

## 2023-02-24 DIAGNOSIS — I2510 Atherosclerotic heart disease of native coronary artery without angina pectoris: Secondary | ICD-10-CM | POA: Diagnosis not present

## 2023-02-24 DIAGNOSIS — Z86718 Personal history of other venous thrombosis and embolism: Secondary | ICD-10-CM | POA: Diagnosis not present

## 2023-02-24 DIAGNOSIS — F900 Attention-deficit hyperactivity disorder, predominantly inattentive type: Secondary | ICD-10-CM | POA: Diagnosis not present

## 2023-02-24 DIAGNOSIS — G4700 Insomnia, unspecified: Secondary | ICD-10-CM

## 2023-02-24 DIAGNOSIS — E119 Type 2 diabetes mellitus without complications: Secondary | ICD-10-CM | POA: Diagnosis not present

## 2023-02-24 DIAGNOSIS — I4891 Unspecified atrial fibrillation: Principal | ICD-10-CM

## 2023-02-24 DIAGNOSIS — I10 Essential (primary) hypertension: Secondary | ICD-10-CM

## 2023-02-24 DIAGNOSIS — Z85828 Personal history of other malignant neoplasm of skin: Secondary | ICD-10-CM | POA: Diagnosis not present

## 2023-02-24 DIAGNOSIS — I160 Hypertensive urgency: Secondary | ICD-10-CM

## 2023-02-24 DIAGNOSIS — F902 Attention-deficit hyperactivity disorder, combined type: Secondary | ICD-10-CM | POA: Diagnosis not present

## 2023-02-24 DIAGNOSIS — R009 Unspecified abnormalities of heart beat: Secondary | ICD-10-CM | POA: Diagnosis present

## 2023-02-24 DIAGNOSIS — Z79899 Other long term (current) drug therapy: Secondary | ICD-10-CM | POA: Diagnosis not present

## 2023-02-24 HISTORY — DX: Hypertensive urgency: I16.0

## 2023-02-24 LAB — COMPREHENSIVE METABOLIC PANEL
ALT: 34 U/L (ref 0–44)
AST: 25 U/L (ref 15–41)
Albumin: 3.9 g/dL (ref 3.5–5.0)
Alkaline Phosphatase: 56 U/L (ref 38–126)
Anion gap: 10 (ref 5–15)
BUN: 15 mg/dL (ref 6–20)
CO2: 25 mmol/L (ref 22–32)
Calcium: 9.2 mg/dL (ref 8.9–10.3)
Chloride: 100 mmol/L (ref 98–111)
Creatinine, Ser: 0.92 mg/dL (ref 0.61–1.24)
GFR, Estimated: >60 mL/min (ref >60)
Glucose, Bld: 145 mg/dL — ABNORMAL HIGH (ref 70–99)
Potassium: 3.7 mmol/L (ref 3.5–5.1)
Sodium: 135 mmol/L (ref 135–145)
Total Bilirubin: 0.9 mg/dL (ref <1.2)
Total Protein: 6.5 g/dL (ref 6.5–8.1)

## 2023-02-24 LAB — ECHOCARDIOGRAM COMPLETE
Area-P 1/2: 3.48 cm2
Height: 76 in
S' Lateral: 3.4 cm
Weight: 4807.79 [oz_av]

## 2023-02-24 LAB — CBC
HCT: 48.5 % (ref 39.0–52.0)
Hemoglobin: 17 g/dL (ref 13.0–17.0)
MCH: 31.1 pg (ref 26.0–34.0)
MCHC: 35.1 g/dL (ref 30.0–36.0)
MCV: 88.7 fL (ref 80.0–100.0)
Platelets: 169 10*3/uL (ref 150–400)
RBC: 5.47 MIL/uL (ref 4.22–5.81)
RDW: 12 % (ref 11.5–15.5)
WBC: 9.4 10*3/uL (ref 4.0–10.5)
nRBC: 0 % (ref 0.0–0.2)

## 2023-02-24 LAB — T4, FREE: Free T4: 1.08 ng/dL (ref 0.61–1.12)

## 2023-02-24 LAB — TSH: TSH: 4.194 u[IU]/mL (ref 0.350–4.500)

## 2023-02-24 LAB — MRSA NEXT GEN BY PCR, NASAL: MRSA by PCR Next Gen: NOT DETECTED

## 2023-02-24 LAB — MAGNESIUM: Magnesium: 2.4 mg/dL (ref 1.7–2.4)

## 2023-02-24 LAB — HIV ANTIBODY (ROUTINE TESTING W REFLEX): HIV Screen 4th Generation wRfx: NONREACTIVE

## 2023-02-24 MED ORDER — RIVAROXABAN 20 MG PO TABS
20.0000 mg | ORAL_TABLET | Freq: Every morning | ORAL | Status: DC
Start: 2023-02-24 — End: 2023-02-24
  Administered 2023-02-24: 20 mg via ORAL
  Filled 2023-02-24: qty 1

## 2023-02-24 MED ORDER — DILTIAZEM HCL 30 MG PO TABS
30.0000 mg | ORAL_TABLET | Freq: Four times a day (QID) | ORAL | Status: DC
  Administered 2023-02-24: 30 mg via ORAL
  Filled 2023-02-24: qty 1

## 2023-02-24 MED ORDER — ROSUVASTATIN CALCIUM 20 MG PO TABS
20.0000 mg | ORAL_TABLET | Freq: Every day | ORAL | Status: DC
  Administered 2023-02-24: 20 mg via ORAL
  Filled 2023-02-24: qty 1

## 2023-02-24 MED ORDER — TRAZODONE HCL 50 MG PO TABS: 100.0000 mg | ORAL_TABLET | Freq: Every day | ORAL | Status: DC

## 2023-02-24 MED ORDER — SODIUM CHLORIDE 0.9% FLUSH
3.0000 mL | INTRAVENOUS | Status: DC | PRN
Start: 2023-02-24 — End: 2023-02-24

## 2023-02-24 MED ORDER — DILTIAZEM HCL ER COATED BEADS 180 MG PO CP24: 180.0000 mg | ORAL_CAPSULE | Freq: Every day | ORAL | 0 refills | Status: DC

## 2023-02-24 MED ORDER — SENNOSIDES-DOCUSATE SODIUM 8.6-50 MG PO TABS: 1.0000 | ORAL_TABLET | Freq: Every evening | ORAL | Status: DC | PRN

## 2023-02-24 MED ORDER — METOPROLOL TARTRATE 12.5 MG HALF TABLET: 12.5000 mg | ORAL_TABLET | Freq: Four times a day (QID) | ORAL | Status: DC

## 2023-02-24 MED ORDER — TRAZODONE HCL 50 MG PO TABS
100.0000 mg | ORAL_TABLET | Freq: Every day | ORAL | Status: DC
  Administered 2023-02-24: 100 mg via ORAL
  Filled 2023-02-24: qty 2

## 2023-02-24 MED ORDER — ONDANSETRON HCL 4 MG/2ML IJ SOLN: 4.0000 mg | Freq: Four times a day (QID) | INTRAMUSCULAR | Status: DC | PRN

## 2023-02-24 MED ORDER — ACETAMINOPHEN 650 MG RE SUPP: 650.0000 mg | Freq: Four times a day (QID) | RECTAL | Status: DC | PRN

## 2023-02-24 MED ORDER — ZOLPIDEM TARTRATE 5 MG PO TABS
5.0000 mg | ORAL_TABLET | Freq: Every evening | ORAL | Status: DC | PRN
  Administered 2023-02-24: 5 mg via ORAL
  Filled 2023-02-24: qty 1

## 2023-02-24 MED ORDER — SODIUM CHLORIDE 0.9% FLUSH
3.0000 mL | Freq: Two times a day (BID) | INTRAVENOUS | Status: DC
  Administered 2023-02-24: 3 mL via INTRAVENOUS

## 2023-02-24 MED ORDER — DILTIAZEM HCL ER COATED BEADS 180 MG PO CP24
180.0000 mg | ORAL_CAPSULE | Freq: Every day | ORAL | Status: DC
Start: 2023-02-24 — End: 2023-02-24
  Administered 2023-02-24: 180 mg via ORAL
  Filled 2023-02-24: qty 1

## 2023-02-24 MED ORDER — PERFLUTREN LIPID MICROSPHERE
1.0000 mL | INTRAVENOUS | Status: DC | PRN
  Administered 2023-02-24: 3 mL via INTRAVENOUS

## 2023-02-24 MED ORDER — ACETAMINOPHEN 325 MG PO TABS: 650.0000 mg | ORAL_TABLET | Freq: Four times a day (QID) | ORAL | Status: DC | PRN

## 2023-02-24 MED ORDER — RIVAROXABAN 10 MG PO TABS: 10.0000 mg | ORAL_TABLET | Freq: Every day | ORAL | Status: DC

## 2023-02-24 MED ORDER — ONDANSETRON HCL 4 MG PO TABS: 4.0000 mg | ORAL_TABLET | Freq: Four times a day (QID) | ORAL | Status: DC | PRN

## 2023-02-24 MED ORDER — SODIUM CHLORIDE 0.9 % IV SOLN
250.0000 mL | INTRAVENOUS | Status: DC | PRN
Start: 2023-02-24 — End: 2023-02-24

## 2023-02-24 NOTE — Progress Notes (Signed)
  Echocardiogram 2D Echocardiogram has been performed.  Delcie Roch 02/24/2023, 9:57 AM

## 2023-02-24 NOTE — Progress Notes (Addendum)
Paroxysmal atrial fibrillation-resolved Patient's nurse reported that converted to sinus rhythm.   EKG showing normal sinus rhythm heart rate 64, left anterior fascicular block and left ventricular hypertrophy pattern. -Continue cardiac monitoring. -Decreasing Cardizem drip 10 mg/h to 5 mg/h to continue it for 1 hour then will stop the Cardizem drip unless developed atrial fibrillation again and starting oral Cardizem 30 mg every 6 hours.

## 2023-02-24 NOTE — H&P (Addendum)
History and Physical    Treyvonne Felipe Klaus ZOX:[id number] DOB: December 22, 1960 DOA: 02/23/2023  PCP: [name], NP   Patient coming from: Home   Chief Complaint:  Chief Complaint  Patient presents with   Palpitations   ED TRIAGE note:  HPI:  Dezi Bill is a 62 y.o. male with medical history significant of essential hypertension,  OSA not using CPAP, factor V Leiden deficiency and DVT on Xarelto, nonobstructive CAD, hyperlipidemia, ADHD and insomnia presented to emergency department at [facility] with complaining of new onset of palpitation and shortness of breath.  Patient denies any chest pain.  Patient reported he checked his heart rate which was 160.  During my evaluation at the bedside patient reported that he has ongoing palpitation for last 1 month however today the fluttering sensation of the chest did not go away and patient noticed some associated shortness of breath.  Patient was very worried and came to ED for evaluation.  Patient reported taking Adderall XL every morning for many years.  Patient reported history of factor V Leiden deficiency and developed DVT 10 years ago and he has been on Xarelto almost 10years.  Also reported that at the baseline his blood pressure always remained in the higher side.  ED Course:  Presentation to ED heart rate 186 and elevated blood pressure 212/102.  Patient has been placed on Cardizem drip and transferred to [facility] for further evaluation. Lab work BMP unremarkable.  Normal magnesium level. CBC unremarkable except elevated hemoglobin 17.  Normal pro time INR.  Pending TSH level. Showed atrial fibrillation RVR heart rate 167. CTA chest no evidence of pulmonary embolism and no acute cardiopulmonary process. Chest x-ray Mild peribronchial thickening and interstitial prominence may reflect bronchitic changes or atypical infection.   In the ED patient has been started on Cardizem drip rate has been improved 186 to  128 and blood pressure has been improved to 153/89 from 212/89.  Patient has been transferred to [facility] to [facility] for further evaluation management of A-fib RVR and hypertensive urgency.  Spoke with on-call cardiology Dr. Havery Moros recommended to continue with Cardizem drip, continue Xarelto, keep patient n.p.o. and possible cardioversion in the daytime.  Significant labs in the ED: Lab Orders         MRSA Next Gen by PCR, Nasal         Basic metabolic panel         Magnesium         CBC         APTT         Protime-INR         TSH         HIV Antibody (routine testing w rflx)         Comprehensive metabolic panel         CBC         Rapid urine drug screen (hospital performed)         Magnesium       Review of Systems:  Review of Systems  Constitutional:  Negative for weight loss.  Respiratory:  Negative for cough and shortness of breath.   Cardiovascular:  Positive for palpitations. Negative for chest pain, orthopnea, claudication, leg swelling and PND.  Gastrointestinal:  Negative for nausea.  Musculoskeletal:  Negative for neck pain.  Neurological:  Negative for dizziness and headaches.  Psychiatric/Behavioral:  The patient is not nervous/anxious.     Past Medical History:  Diagnosis  Date   Allergy [id number]   Anxiety 2003   PTSD   Arthritis 2003   Bilevel positive airway pressure (BPAP) dependence 03/26/2021   Blood glucose abnormal 03/26/2021   Cancer (HCC) 2014   Skin. Melanoma   Chronic post-traumatic stress disorder (PTSD)    Clotting disorder (HCC) 2017   Missing clotting factor   Congestive heart failure (HCC)    Cysts of right lower eyelid 03/26/2021   Depression 2003   Edema 03/26/2021   Embolism and thrombosis of artery (HCC) 03/26/2021   Enchondroma of left femur 01/16/2020   Hyperlipidemia 2000   Hypersomnia with sleep apnea 03/26/2021   Hypertension    Keratitis 03/26/2021   Neck muscle spasm    Other chronic allergic conjunctivitis  03/26/2021   Est'd dx, not well controlled.  Patanol not working.  I wrote a script for Zaditor to replace Patanol.  f/u in one week.   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 No pertinent surgical history.   reports that he quit smoking about 20 years ago. His smoking use included cigarettes. He started smoking about 36 years ago. He has a 15 pack-year smoking history. He has never used smokeless tobacco. He reports current alcohol use of about 9.0 - 12.0 standard drinks of alcohol per week. He reports that he does not use drugs.  Allergies  Allergen Reactions   Lisinopril Cough   Penicillin G    Penicillin V Potassium    Penicillins Rash    Has patient had a PCN reaction causing immediate rash, facial/tongue/throat swelling, SOB or lightheadedness with hypotension: Yes Has patient had a PCN reaction causing severe rash involving mucus membranes or skin necrosis: No Has patient had a PCN reaction that required hospitalization No Has patient had a PCN reaction occurring within the last 10 years: No If all of the above answers are "NO", then may proceed with Cephalosporin use.     Family History  Problem Relation Age of Onset   Healthy Mother    Healthy Father     Prior to Admission medications   Medication Sig Start Date End Date Taking? Authorizing Provider  ALPRAZolam (XANAX) 0.5 MG tablet TAKE 1 TABLET(0.5 MG) BY MOUTH TWICE DAILY AS NEEDED FOR ANXIETY. DO NOT TAKE AT THE SAME TIME AS AMBIEN 10/15/22   [name], NP  amphetamine-dextroamphetamine (ADDERALL XR) 30 MG 24 hr capsule Take 1 capsule (30 mg total) by mouth every morning. Script 4 of 4. 10/11/22   Early, Sung Amabile, NP  amphetamine-dextroamphetamine (ADDERALL XR) 30 MG 24 hr capsule Take 1 capsule (30 mg total) by mouth every  morning. Script 2 of 4 12/21/22   Early, Sung Amabile, NP  amphetamine-dextroamphetamine (ADDERALL XR) 30 MG 24 hr capsule Take 1 capsule (30 mg total) by mouth every morning. 01/18/23   [name], NP  amphetamine-dextroamphetamine (ADDERALL XR) 30 MG 24 hr capsule Take 1 capsule (30 mg total) by mouth every morning. 02/15/23 03/17/23  [name], NP  fexofenadine (ALLEGRA) 180 MG tablet Take 1 tablet (180 mg total) by mouth daily. 01/05/19   Wieters, Hallie C, PA-C  furosemide (LASIX) 40 MG tablet Take by mouth.    [provider]  losartan (COZAAR) 100 MG tablet  Take 1 tablet (100 mg total) by mouth daily. 11/12/21   [name], NP  methylPREDNISolone (MEDROL DOSEPAK) 4 MG TBPK tablet 6 day dose pack. Take as directed. May repeat in 3-4 weeks if symptoms persist. 12/21/22   Early, Sung Amabile, NP  ondansetron (ZOFRAN) 8 MG tablet Take 1 tablet (8 mg total) by mouth every 8 (eight) hours as needed for nausea or vomiting. 07/03/21   [name], NP  pregabalin (LYRICA) 25 MG capsule Take 1 capsule (25 mg total) by mouth 2 (two) times daily. 05/27/22   [name], NP  rosuvastatin (CRESTOR) 20 MG tablet Take 1 tablet (20 mg total) by mouth daily. 07/14/22 07/09/23  Jodelle Red, MD  tadalafil (CIALIS) 5 MG tablet Take 1 tablet (5 mg total) by mouth daily as needed for erectile dysfunction. Patient not taking: Reported on 12/21/2022 03/26/21   [name], NP  traZODone (DESYREL) 100 MG tablet Take 1 tablet (100 mg total) by mouth at bedtime. 04/14/22   [name], NP  triamcinolone ointment (KENALOG) 0.1 %     [provider]  Vitamin D, Ergocalciferol, (DRISDOL) 1.25 MG (50000 UNIT) CAPS capsule Take 1 capsule (50,000 Units total) by mouth every 7 (seven) days. 07/15/22   Early, Sung Amabile, NP  XARELTO 20 MG TABS tablet Take 1 tablet (20 mg total) by mouth every morning. 04/14/22   [name], NP  zolpidem (AMBIEN) 5 MG tablet TAKE 1 TABLET(5 MG) BY MOUTH AT BEDTIME AS NEEDED  FOR SLEEP 12/21/22   [name], NP     Physical Exam: Vitals:   02/24/23 0045 02/24/23 0100 02/24/23 0115 02/24/23 0156  BP: 136/80 (!) 147/76 122/73 (!) 153/89  Pulse:  (!) 119 (!) 128   Resp: 18 18 12 16   Temp:   98.3 F (36.8 C) 97.6 F (36.4 C)  TempSrc:    Oral  SpO2:   100% 92%  Weight:    (!) 136.3 kg  Height:    6\' 4"  (1.93 m)    Physical Exam Constitutional:      General: He is not in acute distress.    Appearance: He is not ill-appearing.  HENT:     Mouth/Throat:     Mouth: Mucous membranes are moist.  Eyes:     Conjunctiva/sclera: Conjunctivae normal.  Cardiovascular:     Rate and Rhythm: Tachycardia present. Rhythm irregular.     Pulses: Normal pulses.     Heart sounds: Normal heart sounds. No murmur heard. Pulmonary:     Effort: Pulmonary effort is normal.     Breath sounds: Normal breath sounds.  Musculoskeletal:     Cervical back: Neck supple.     Right lower leg: No edema.     Left lower leg: No edema.  Skin:    Capillary Refill: Capillary refill takes less than 2 seconds.  Neurological:     Mental Status: He is alert and oriented to person, place, and time.  Psychiatric:        Mood and Affect: Mood normal.        Thought Content: Thought content normal.      Labs on Admission: I have personally reviewed following labs and imaging studies  CBC: Recent Labs  Lab 02/23/23 2214 02/24/23 0237  WBC 9.8 9.4  HGB 17.6* 17.0  HCT 50.6 48.5  MCV 89.2 88.7  PLT 179 169   Basic Metabolic Panel: Recent Labs  Lab 02/23/23 2214  NA 138  K 3.8  CL 98  CO2 29  GLUCOSE 121*  BUN 19  CREATININE 1.10  CALCIUM 9.8  MG 2.2   GFR: Estimated Creatinine Clearance: 107.7 mL/min (by C-G formula based on SCr of 1.1 mg/dL). Liver Function Tests: No results for input(s): "AST", "ALT", "ALKPHOS", "BILITOT", "PROT", "ALBUMIN" in the last 168 hours. No results for input(s): "LIPASE", "AMYLASE" in the last 168 hours. No results for input(s):  "AMMONIA" in the last 168 hours. Coagulation Profile: Recent Labs  Lab 02/23/23 2235  INR 1.0   Cardiac Enzymes: No results for input(s): "CKTOTAL", "CKMB", "CKMBINDEX", "TROPONINI", "TROPONINIHS" in the last 168 hours. BNP (last 3 results) No results for input(s): "BNP" in the last 8760 hours. HbA1C: No results for input(s): "HGBA1C" in the last 72 hours. CBG: No results for input(s): "GLUCAP" in the last 168 hours. Lipid Profile: No results for input(s): "CHOL", "HDL", "LDLCALC", "TRIG", "CHOLHDL", "LDLDIRECT" in the last 72 hours. Thyroid Function Tests: No results for input(s): "TSH", "T4TOTAL", "FREET4", "T3FREE", "THYROIDAB" in the last 72 hours. Anemia Panel: No results for input(s): "VITAMINB12", "FOLATE", "FERRITIN", "TIBC", "IRON", "RETICCTPCT" in the last 72 hours. Urine analysis:    Component Value Date/Time   LABSPEC >=1.030 02/10/2019 0927   PHURINE 5.5 02/10/2019 0927   GLUCOSEU NEGATIVE 02/10/2019 0927   HGBUR NEGATIVE 02/10/2019 0927   BILIRUBINUR NEGATIVE 02/10/2019 0927   KETONESUR NEGATIVE 02/10/2019 0927   PROTEINUR NEGATIVE 02/10/2019 0927   UROBILINOGEN 0.2 02/10/2019 0927   NITRITE NEGATIVE 02/10/2019 0927   LEUKOCYTESUR NEGATIVE 02/10/2019 4540    Radiological Exams on Admission: I have personally reviewed images CT Angio Chest PE W and/or Wo Contrast Result Date: 02/23/2023 CLINICAL DATA:  Pulmonary embolism (PE) suspected, high prob hx of factor V leiden and new onset AF with hx of DVT. Palpitations, shortness of breath EXAM: CT ANGIOGRAPHY CHEST WITH CONTRAST TECHNIQUE: Multidetector CT imaging of the chest was performed using the standard protocol during bolus administration of intravenous contrast. Multiplanar CT image reconstructions and MIPs were obtained to evaluate the vascular anatomy. RADIATION DOSE REDUCTION: This exam was performed according to the departmental dose-optimization program which includes automated exposure control, adjustment  of the mA and/or kV according to patient size and/or use of iterative reconstruction technique. CONTRAST:  75mL OMNIPAQUE IOHEXOL 350 MG/ML SOLN COMPARISON:  08/20/2021 FINDINGS: Cardiovascular: No filling defects in the pulmonary arteries to suggest pulmonary emboli. Heart is normal size. Aorta is normal caliber. Mediastinum/Nodes: No mediastinal, hilar, or axillary adenopathy. Trachea and esophagus are unremarkable. Thyroid unremarkable. Lungs/Pleura: Lungs are clear. No focal airspace opacities or suspicious nodules. No effusions. Upper Abdomen: No acute findings Musculoskeletal: Chest wall soft tissues are unremarkable. No acute bony abnormality. Review of the MIP images confirms the above findings. IMPRESSION: No evidence of pulmonary embolus. No acute cardiopulmonary disease. Electronically Signed   By: Charlett Nose M.D.   On: 02/23/2023 23:02   DG Chest Port 1 View Result Date: 02/23/2023 CLINICAL DATA:  Palpitations, shortness of breath EXAM: PORTABLE CHEST 1 VIEW COMPARISON:  12/23/2015 FINDINGS: Mild peribronchial thickening and interstitial prominence. No confluent opacities or effusions. Heart is normal size. Mediastinal contours within normal limits. No bony abnormality. IMPRESSION: Mild peribronchial thickening and interstitial prominence may reflect bronchitic changes or atypical infection. Electronically Signed   By: Charlett Nose M.D.   On: 02/23/2023 22:35     EKG: My personal interpretation of EKG shows: A-fib RVR heart rate 164.    Assessment/Plan: Principal Problem:   Atrial fibrillation with RVR (HCC)  Active Problems:   ADHD   Essential hypertension   History of DVT (deep vein thrombosis)   Insomnia   History of CAD (coronary artery disease)   Hypertensive urgency    Assessment and Plan: A-fib RVR-resolved New onset of atrial fibrillation > Patient presenting to emergency department complaining of palpitation initially found to have A-fib RVR heart rate 164.  Patient  being started on Cardizem drip and heart rate has been improved to 128.  Still having atrial fibrillation but heart rate has been improved.  Patient denies any chest pain however initially reported shortness of breath which has been improved once heart rate has been improved. -Chest x-ray showed mild peribronchial thickening and interstitial prominence may reflect bronchitic change or atypical infection.  Patient is afebrile.  No leukocytosis.  No concern for pneumonia.   -CTA chest no evidence of pulmonary embolism and no acute cardiopulmonary disease process. - BMP and mag no electrolyte arrangement.  Pending TSH level.  Checking UDS. -Concern for A-fib RVR and hypertensive urgency in the setting of Adderall XL use. -Patient's CHA2DS2-VASc is score is 1. - Currently plan to continue Cardizem drip. -Continue Xarelto 20 mg daily. -Spoke with on-call cardiology Havery Moros recommended to continue the Cardizem drip, continue Xarelto and keep patient n.p.o. for possible cardioversion in the daytime. - Will follow-up with cardiology for further recommendation. -Obtaining echocardiogram - Continue cardiac monitoring Addendum -At 3:50 AM patient's nurse reported that converted to sinus rhythm.   EKG showing normal sinus rhythm heart rate 64, left anterior fascicular block and left ventricular hypertrophy pattern. -Continue cardiac monitoring. -Decreasing Cardizem drip 10 mg/h to 5 mg/h to continue it for 1 hour then will stop the Cardizem drip and starting oral Cardizem 30 mg every 6 hours. -Continue cardiac monitoring.  History of DVT on Xarelto History of factor V Leyden deficiency - Continue Xarelto 20 mg daily.  History of ADHD -Reported taking Adderall XL for a long time.  Currently holding in the setting of atrial fibrillation and hypertensive urgency.  Hypertensive urgency Essential hypertension -Initial blood pressure 212/102 which has been improved to 153/89 with Cardizem drip.  Holding  oral losartan for now as patient is on Cardizem drip.  Goal to improve blood pressure 25% over the course of 24 hours. -Will resume losartan once Cardizem drip will be weaned off.  Insomnia -Continue trazodone 100 mg at bedtime and Ambien 5 mg as needed at bedtime.  DVT prophylaxis:  Xarelto Code Status:  Full Code Diet: Currently NPO. Family Communication: Currently no family member at bedside. Disposition Plan: Pending improvement of atrial fibrillation/tentative plan for cardioversion in the daytime. Consults: Cardiology Admission status:   Inpatient, Step Down Unit  Severity of Illness: The appropriate patient status for this patient is INPATIENT. Inpatient status is judged to be reasonable and necessary in order to provide the required intensity of service to ensure the patient's safety. The patient's presenting symptoms, physical exam findings, and initial radiographic and laboratory data in the context of their chronic comorbidities is felt to place them at high risk for further clinical deterioration. Furthermore, it is not anticipated that the patient will be medically stable for discharge from the hospital within 2 midnights of admission.   * I certify that at the point of admission it is my clinical judgment that the patient will require inpatient hospital care spanning beyond 2 midnights from the point of admission due to high intensity of service, high risk for further deterioration and high frequency of surveillance  required.Marland Kitchen    Tereasa Coop, MD [facility]  How to contact the [facility] Attending or Consulting provider 7A - 7P or covering provider during after hours 7P -7A, for this patient.  Check the care team in [facility] and look for a) attending/consulting [facility] provider listed and b) the [facility] team listed Log into [email or web address] and use [facility] universal password to access. If you do not have the password, please contact the hospital operator. Locate the [facility] provider  you are looking for under [facility] and page to a number that you can be directly reached. If you still have difficulty reaching the provider, please page the [facility] (Director on Call) for the Hospitalists listed on amion for assistance.  02/24/2023, 3:02 AM

## 2023-02-24 NOTE — Discharge Summary (Signed)
Physician Discharge Summary  Dean Knox:[id number] DOB: 1960-12-11 DOA: 02/23/2023  PCP: Dean Eth, NP  Admit date: 02/23/2023 Discharge date: 02/24/2023  Admitted From: Home Disposition: Home  Recommendations for Outpatient Follow-up:  Follow up with PCP in 1-2 weeks Will send referral to cardiology and A-fib clinic for follow-up You will need a repeat sleep study.  Home Health: N/A Equipment/Devices: N/A  Discharge Condition: Stable CODE STATUS: Full code Diet recommendation: Low-salt diet  Discharge summary: 62 year old gentleman with history of hypertension, sleep apnea currently not using CPAP, factor V Leyden deficiency and history of DVT on maintenance Xarelto, hypertension hyperlipidemia and insomnia ADHD presented to the emergency room with new onset of palpitations and shortness of breath.  Denies any history of A-fib but he had occasionally felt palpitations in the past.  In the emergency room blood pressures were stable.  He was found to be in rapid A-fib with ventricular rate of 186.  Blood pressure 212/102.  TSH was normal.  Electrolytes were adequate.  CT angiogram without any evidence of pulmonary embolism or acute process.  Echocardiogram was essentially normal. Patient was admitted with Cardizem infusion, he quickly converted to sinus rhythm and has maintained sinus rhythm.  Today he is asymptomatic, he was given Cardizem 180 mg in the morning.  Patient has remained in sinus rhythm.  He is already anticoagulated with Xarelto.  Plan: Discharging home with Cardizem CD 180 mg daily. Continue Xarelto 20 mg daily, he is on infinite dosing. Will need repeat sleep study and treatment for sleep apnea Outpatient follow-up at A-fib clinic and cardiology clinic.  Stable for discharge  Discharge Diagnoses:  Principal Problem:   Atrial fibrillation with RVR (HCC) Active Problems:   ADHD   Essential hypertension   History of DVT (deep vein thrombosis)    Insomnia   History of CAD (coronary artery disease)   Hypertensive urgency    Discharge Instructions  Discharge Instructions     Amb referral to AFIB Clinic   Complete by: As directed    Ambulatory referral to Cardiology   Complete by: As directed    Diet - low sodium heart healthy   Complete by: As directed    Increase activity slowly   Complete by: As directed       Allergies as of 02/24/2023       Reactions   Lisinopril Cough   Penicillin G    Penicillin V Potassium    Penicillins Rash        Medication List     STOP taking these medications    tadalafil 5 MG tablet Commonly known as: Cialis   triamcinolone ointment 0.1 % Commonly known as: KENALOG       TAKE these medications    ALPRAZolam 0.5 MG tablet Commonly known as: XANAX TAKE 1 TABLET(0.5 MG) BY MOUTH TWICE DAILY AS NEEDED FOR ANXIETY. DO NOT TAKE AT THE SAME TIME AS AMBIEN   amphetamine-dextroamphetamine 30 MG 24 hr capsule Commonly known as: Adderall XR Take 1 capsule (30 mg total) by mouth every morning.   diltiazem 180 MG 24 hr capsule Commonly known as: CARDIZEM CD Take 1 capsule (180 mg total) by mouth daily. Start taking on: February 25, 2023   furosemide 40 MG tablet Commonly known as: LASIX Take 40 mg by mouth daily as needed for fluid or edema.   losartan 100 MG tablet Commonly known as: COZAAR Take 1 tablet (100 mg total) by mouth daily.   methylPREDNISolone 4 MG Tbpk  tablet Commonly known as: MEDROL DOSEPAK 6 day dose pack. Take as directed. May repeat in 3-4 weeks if symptoms persist.   ondansetron 8 MG tablet Commonly known as: Zofran Take 1 tablet (8 mg total) by mouth every 8 (eight) hours as needed for nausea or vomiting.   pregabalin 25 MG capsule Commonly known as: Lyrica Take 1 capsule (25 mg total) by mouth 2 (two) times daily.   rosuvastatin 20 MG tablet Commonly known as: CRESTOR Take 1 tablet (20 mg total) by mouth daily.   traZODone 100 MG  tablet Commonly known as: DESYREL Take 1 tablet (100 mg total) by mouth at bedtime.   Vitamin D (Ergocalciferol) 1.25 MG (50000 UNIT) Caps capsule Commonly known as: DRISDOL Take 1 capsule (50,000 Units total) by mouth every 7 (seven) days.   Xarelto 20 MG Tabs tablet Generic drug: rivaroxaban Take 1 tablet (20 mg total) by mouth every morning.   zolpidem 5 MG tablet Commonly known as: AMBIEN TAKE 1 TABLET(5 MG) BY MOUTH AT BEDTIME AS NEEDED FOR SLEEP        Allergies  Allergen Reactions   Lisinopril Cough   Penicillin G    Penicillin V Potassium    Penicillins Rash    Consultations: None   Procedures/Studies: ECHOCARDIOGRAM COMPLETE Result Date: 02/24/2023    ECHOCARDIOGRAM REPORT   Patient Name:   Dean Knox Date of Exam: 02/24/2023 Medical Rec #:  [id number]             Height:       76.0 in Accession #:    [id number]            Weight:       300.5 lb Date of Birth:  Nov 26, 1960             BSA:          2.634 m Patient Age:    62 years              BP:           151/82 mmHg Patient Gender: M                     HR:           61 bpm. Exam Location:  Inpatient Procedure: 2D Echo, Cardiac Doppler and Color Doppler Indications:    Atrial fibrillation  History:        Patient has no prior history of Echocardiogram examinations.                 CAD; Risk Factors:Hypertension, Dyslipidemia and Sleep Apnea.  Sonographer:    Delcie Roch RDCS Referring Phys: [id number] SUBRINA SUNDIL IMPRESSIONS  1. Left ventricular ejection fraction, by estimation, is 60 to 65%. The left ventricle has normal function. The left ventricle has no regional wall motion abnormalities. Left ventricular diastolic parameters are indeterminate.  2. Right ventricular systolic function is normal. The right ventricular size is normal.  3. The mitral valve is normal in structure. No evidence of mitral valve regurgitation. No evidence of mitral stenosis.  4. The aortic valve is normal in structure. Aortic  valve regurgitation is not visualized. No aortic stenosis is present.  5. The inferior vena cava is normal in size with greater than 50% respiratory variability, suggesting right atrial pressure of 3 mmHg. FINDINGS  Left Ventricle: Left ventricular ejection fraction, by estimation, is 60 to 65%. The left ventricle has normal function. The left ventricle has no regional  wall motion abnormalities. Definity contrast agent was given IV to delineate the left ventricular  endocardial borders. The left ventricular internal cavity size was normal in size. There is no left ventricular hypertrophy. Left ventricular diastolic parameters are indeterminate. Right Ventricle: The right ventricular size is normal. No increase in right ventricular wall thickness. Right ventricular systolic function is normal. Left Atrium: Left atrial size was normal in size. Right Atrium: Right atrial size was normal in size. Pericardium: There is no evidence of pericardial effusion. Mitral Valve: The mitral valve is normal in structure. No evidence of mitral valve regurgitation. No evidence of mitral valve stenosis. Tricuspid Valve: The tricuspid valve is normal in structure. Tricuspid valve regurgitation is trivial. No evidence of tricuspid stenosis. Aortic Valve: The aortic valve is normal in structure. Aortic valve regurgitation is not visualized. No aortic stenosis is present. Pulmonic Valve: The pulmonic valve was normal in structure. Pulmonic valve regurgitation is not visualized. No evidence of pulmonic stenosis. Aorta: The aortic root is normal in size and structure. Venous: The inferior vena cava is normal in size with greater than 50% respiratory variability, suggesting right atrial pressure of 3 mmHg. IAS/Shunts: No atrial level shunt detected by color flow Doppler.  LEFT VENTRICLE PLAX 2D LVIDd:         4.80 cm LVIDs:         3.40 cm LV PW:         1.10 cm LV IVS:        1.00 cm LVOT diam:     2.10 cm LV SV:         70 LV SV Index:   26  LVOT Area:     3.46 cm  RIGHT VENTRICLE             IVC RV Basal diam:  2.30 cm     IVC diam: 2.00 cm RV S prime:     13.20 cm/s TAPSE (M-mode): 1.9 cm LEFT ATRIUM           Index        RIGHT ATRIUM           Index LA diam:      3.20 cm 1.22 cm/m   RA Area:     13.80 cm LA Vol (A2C): 55.2 ml 20.96 ml/m  RA Volume:   32.40 ml  12.30 ml/m  AORTIC VALVE LVOT Vmax:   96.40 cm/s LVOT Vmean:  63.000 cm/s LVOT VTI:    0.201 m  AORTA Ao Root diam: 3.30 cm Ao Asc diam:  3.10 cm MITRAL VALVE MV Area (PHT): 3.48 cm    SHUNTS MV Decel Time: 218 msec    Systemic VTI:  0.20 m MV E velocity: 87.30 cm/s  Systemic Diam: 2.10 cm MV A velocity: 65.60 cm/s MV E/A ratio:  1.33 Arvilla Meres MD Electronically signed by Arvilla Meres MD Signature Date/Time: 02/24/2023/10:06:12 AM    Final    CT Angio Chest PE W and/or Wo Contrast Result Date: 02/23/2023 CLINICAL DATA:  Pulmonary embolism (PE) suspected, high prob hx of factor V leiden and new onset AF with hx of DVT. Palpitations, shortness of breath EXAM: CT ANGIOGRAPHY CHEST WITH CONTRAST TECHNIQUE: Multidetector CT imaging of the chest was performed using the standard protocol during bolus administration of intravenous contrast. Multiplanar CT image reconstructions and MIPs were obtained to evaluate the vascular anatomy. RADIATION DOSE REDUCTION: This exam was performed according to the departmental dose-optimization program which includes automated exposure control, adjustment of the  mA and/or kV according to patient size and/or use of iterative reconstruction technique. CONTRAST:  75mL OMNIPAQUE IOHEXOL 350 MG/ML SOLN COMPARISON:  08/20/2021 FINDINGS: Cardiovascular: No filling defects in the pulmonary arteries to suggest pulmonary emboli. Heart is normal size. Aorta is normal caliber. Mediastinum/Nodes: No mediastinal, hilar, or axillary adenopathy. Trachea and esophagus are unremarkable. Thyroid unremarkable. Lungs/Pleura: Lungs are clear. No focal airspace  opacities or suspicious nodules. No effusions. Upper Abdomen: No acute findings Musculoskeletal: Chest wall soft tissues are unremarkable. No acute bony abnormality. Review of the MIP images confirms the above findings. IMPRESSION: No evidence of pulmonary embolus. No acute cardiopulmonary disease. Electronically Signed   By: Charlett Nose M.D.   On: 02/23/2023 23:02   DG Chest Port 1 View Result Date: 02/23/2023 CLINICAL DATA:  Palpitations, shortness of breath EXAM: PORTABLE CHEST 1 VIEW COMPARISON:  12/23/2015 FINDINGS: Mild peribronchial thickening and interstitial prominence. No confluent opacities or effusions. Heart is normal size. Mediastinal contours within normal limits. No bony abnormality. IMPRESSION: Mild peribronchial thickening and interstitial prominence may reflect bronchitic changes or atypical infection. Electronically Signed   By: Charlett Nose M.D.   On: 02/23/2023 22:35   (Echo, Carotid, EGD, Colonoscopy, ERCP)    Subjective: Patient seen in the morning rounds.  Denies any complaints.  He converted to sinus rhythm after few hours being on Cardizem 10 mg/h. Patient was mobilized around in the hallway with no recurrence of tachycardia or A-fib.   Discharge Exam: Vitals:   02/24/23 0545 02/24/23 0744  BP: 115/68 (!) 151/82  Pulse:  69  Resp: 16 11  Temp: (!) 97.5 F (36.4 C) 97.7 F (36.5 C)  SpO2:  90%   Vitals:   02/24/23 0115 02/24/23 0156 02/24/23 0545 02/24/23 0744  BP: 122/73 (!) 153/89 115/68 (!) 151/82  Pulse: (!) 128   69  Resp: 12 16 16 11   Temp: 98.3 F (36.8 C) 97.6 F (36.4 C) (!) 97.5 F (36.4 C) 97.7 F (36.5 C)  TempSrc:  Oral Oral Oral  SpO2: 100% 92%  90%  Weight:  (!) 136.3 kg (!) 136.3 kg   Height:  6\' 4"  (1.93 m)      General: Pt is alert, awake, not in acute distress Cardiovascular: RRR, S1/S2 +, no rubs, no gallops Respiratory: CTA bilaterally, no wheezing, no rhonchi Abdominal: Soft, NT, ND, bowel sounds + Extremities: no edema, no  cyanosis    The results of significant diagnostics from this hospitalization (including imaging, microbiology, ancillary and laboratory) are listed below for reference.     Microbiology: Recent Results (from the past 240 hours)  MRSA Next Gen by PCR, Nasal     Status: None   Collection Time: 02/24/23  2:04 AM   Specimen: Nasal Mucosa; Nasal Swab  Result Value Ref Range Status   MRSA by PCR Next Gen NOT DETECTED NOT DETECTED Final    Comment: (NOTE) The GeneXpert MRSA Assay (FDA approved for NASAL specimens only), is one component of a comprehensive MRSA colonization surveillance program. It is not intended to diagnose MRSA infection nor to guide or monitor treatment for MRSA infections. Test performance is not FDA approved in patients less than 16 years old. Performed at [facility], [address] [place], [place] [address]      Labs: BNP (last 3 results) No results for input(s): "BNP" in the last 8760 hours. Basic Metabolic Panel: Recent Labs  Lab 02/23/23 2214 02/24/23 0237  NA 138 135  K 3.8 3.7  CL  98 100  CO2 29 25  GLUCOSE 121* 145*  BUN 19 15  CREATININE 1.10 0.92  CALCIUM 9.8 9.2  MG 2.2 2.4   Liver Function Tests: Recent Labs  Lab 02/24/23 0237  AST 25  ALT 34  ALKPHOS 56  BILITOT 0.9  PROT 6.5  ALBUMIN 3.9   No results for input(s): "LIPASE", "AMYLASE" in the last 168 hours. No results for input(s): "AMMONIA" in the last 168 hours. CBC: Recent Labs  Lab 02/23/23 2214 02/24/23 0237  WBC 9.8 9.4  HGB 17.6* 17.0  HCT 50.6 48.5  MCV 89.2 88.7  PLT 179 169   Cardiac Enzymes: No results for input(s): "CKTOTAL", "CKMB", "CKMBINDEX", "TROPONINI" in the last 168 hours. BNP: Invalid input(s): "POCBNP" CBG: No results for input(s): "GLUCAP" in the last 168 hours. D-Dimer No results for input(s): "DDIMER" in the last 72 hours. Hgb A1c No results for input(s): "HGBA1C" in the last 72 hours. Lipid Profile No results for input(s):  "CHOL", "HDL", "LDLCALC", "TRIG", "CHOLHDL", "LDLDIRECT" in the last 72 hours. Thyroid function studies Recent Labs    02/24/23 0237  TSH 4.194   Anemia work up No results for input(s): "VITAMINB12", "FOLATE", "FERRITIN", "TIBC", "IRON", "RETICCTPCT" in the last 72 hours. Urinalysis    Component Value Date/Time   LABSPEC >=1.030 02/10/2019 0927   PHURINE 5.5 02/10/2019 0927   GLUCOSEU NEGATIVE 02/10/2019 0927   HGBUR NEGATIVE 02/10/2019 0927   BILIRUBINUR NEGATIVE 02/10/2019 0927   KETONESUR NEGATIVE 02/10/2019 0927   PROTEINUR NEGATIVE 02/10/2019 0927   UROBILINOGEN 0.2 02/10/2019 0927   NITRITE NEGATIVE 02/10/2019 0927   LEUKOCYTESUR NEGATIVE 02/10/2019 0927   Sepsis Labs Recent Labs  Lab 02/23/23 2214 02/24/23 0237  WBC 9.8 9.4   Microbiology Recent Results (from the past 240 hours)  MRSA Next Gen by PCR, Nasal     Status: None   Collection Time: 02/24/23  2:04 AM   Specimen: Nasal Mucosa; Nasal Swab  Result Value Ref Range Status   MRSA by PCR Next Gen NOT DETECTED NOT DETECTED Final    Comment: (NOTE) The GeneXpert MRSA Assay (FDA approved for NASAL specimens only), is one component of a comprehensive MRSA colonization surveillance program. It is not intended to diagnose MRSA infection nor to guide or monitor treatment for MRSA infections. Test performance is not FDA approved in patients less than 37 years old. Performed at [facility], [address] [place], [place] [address]      Time coordinating discharge: 35 minutes  SIGNED:   Dorcas Carrow, MD  [facility] 02/24/2023, 10:51 AM

## 2023-02-24 NOTE — ED Provider Notes (Addendum)
[place] EMERGENCY DEPARTMENT AT [facility] Provider Note   CSN: [id number] Arrival date & time: 02/23/23  2151     History  Chief Complaint  Patient presents with   Palpitations    Dean Knox is a 62 y.o. male.  HPI    Pt comes in with cc of palpitations.  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  Patient has been having episodes of palpitations over the last month.  The episodes typically will last for few seconds, and he thought that they were PVCs.  Patient is a former Associate Professor.  Today however his symptoms have been more constant and more severe.  He comes to the ER with palpitations that have been going on at least for the last hour.  He has no chest pain or shortness of breath at this time.  Patient denies any substance use disorder, heavy caffeine use, stimulant use, energy drink use. He is on ADHD meds.   Home Medications Prior to Admission medications   Medication Sig Start Date End Date Taking? Authorizing Provider  ALPRAZolam (XANAX) 0.5 MG tablet TAKE 1 TABLET(0.5 MG) BY MOUTH TWICE DAILY AS NEEDED FOR ANXIETY. DO NOT TAKE AT THE SAME TIME AS AMBIEN 10/15/22  Yes Early, Sung Amabile, NP  amphetamine-dextroamphetamine (ADDERALL XR) 30 MG 24 hr capsule Take 1 capsule (30 mg total) by mouth every morning. 02/15/23 03/17/23 Yes Early, Sung Amabile, NP  furosemide (LASIX) 40 MG tablet Take 40 mg by mouth daily as needed for fluid or edema.   Yes [provider]  losartan (COZAAR) 100 MG tablet Take 1 tablet (100 mg total) by mouth daily. 11/12/21  Yes Early, Sung Amabile, NP  diltiazem (CARDIZEM CD) 180 MG 24 hr capsule Take 1 capsule (180 mg total) by mouth daily. 02/25/23 03/27/23  Dorcas Carrow, MD  methylPREDNISolone (MEDROL DOSEPAK) 4 MG TBPK tablet 6 day dose pack. Take as directed. May repeat in 3-4 weeks if symptoms persist. 12/21/22   Early, Sung Amabile, NP  ondansetron (ZOFRAN) 8 MG tablet  Take 1 tablet (8 mg total) by mouth every 8 (eight) hours as needed for nausea or vomiting. 07/03/21   [name], NP  pregabalin (LYRICA) 25 MG capsule Take 1 capsule (25 mg total) by mouth 2 (two) times daily. 05/27/22   [name], NP  rosuvastatin (CRESTOR) 20 MG tablet Take 1 tablet (20 mg total) by mouth daily. 07/14/22 07/09/23  Jodelle Red, MD  traZODone (DESYREL) 100 MG tablet Take 1 tablet (100 mg total) by mouth at bedtime. 04/14/22   [name], NP  Vitamin D, Ergocalciferol, (DRISDOL) 1.25 MG (50000 UNIT) CAPS capsule Take 1 capsule (50,000 Units total) by mouth every 7 (seven) days. 07/15/22   Early, Sung Amabile, NP  XARELTO 20 MG TABS tablet Take 1 tablet (20 mg total) by mouth every morning. 04/14/22   Early, Sung Amabile, NP  zolpidem (AMBIEN) 5 MG tablet TAKE 1 TABLET(5 MG) BY MOUTH AT BEDTIME AS NEEDED FOR SLEEP 12/21/22   Early, Sung Amabile, NP      Allergies    Lisinopril, Penicillin g, Penicillin v potassium, and Penicillins    Review of Systems   Review of Systems  All other systems reviewed and are negative.   Physical Exam Updated Vital Signs BP (!) 151/82 (BP Location: Left Arm) Comment: taken twice  Pulse 69   Temp 97.7 F (36.5 C) (Oral)   Resp 11  Ht 6\' 4"  (1.93 m)   Wt (!) 136.3 kg Comment: Scale A  SpO2 90%   BMI 36.58 kg/m  Physical Exam Vitals and nursing note reviewed.  Constitutional:      Appearance: He is well-developed.  HENT:     Head: Atraumatic.  Eyes:     Extraocular Movements: Extraocular movements intact.     Pupils: Pupils are equal, round, and reactive to light.  Cardiovascular:     Rate and Rhythm: Tachycardia present. Rhythm irregular.  Pulmonary:     Effort: Pulmonary effort is normal.  Musculoskeletal:        General: No swelling or tenderness.     Cervical back: Neck supple.  Skin:    General: Skin is warm.  Neurological:     Mental Status: He is alert and oriented to person, place, and time.     ED Results /  Procedures / Treatments   Labs (all labs ordered are listed, but only abnormal results are displayed) Labs Reviewed  BASIC METABOLIC PANEL - Abnormal; Notable for the following components:      Result Value   Glucose, Bld 121 (*)    All other components within normal limits  CBC - Abnormal; Notable for the following components:   Hemoglobin 17.6 (*)    All other components within normal limits  COMPREHENSIVE METABOLIC PANEL - Abnormal; Notable for the following components:   Glucose, Bld 145 (*)    All other components within normal limits  MRSA NEXT GEN BY PCR, NASAL  MAGNESIUM  APTT  PROTIME-INR  HIV ANTIBODY (ROUTINE TESTING W REFLEX)  CBC  MAGNESIUM  TSH  T4, FREE  RAPID URINE DRUG SCREEN, HOSP PERFORMED    EKG EKG Interpretation Date/Time:  Tuesday February 23 2023 21:59:15 EST Ventricular Rate:  167 PR Interval:    QRS Duration:  91 QT Interval:  275 QTC Calculation: 459 R Axis:   -55  Text Interpretation: Atrial fibrillation with rapid V-rate LAD, consider LAFB or inferior infarct Abnormal R-wave progression, late transition Probable LVH with secondary repol abnrm new changes noted Confirmed by Derwood Kaplan [phone]) on 02/23/2023 10:08:44 PM  Radiology ECHOCARDIOGRAM COMPLETE Result Date: 02/24/2023    ECHOCARDIOGRAM REPORT   Patient Name:   DRE FORGACS Riding Date of Exam: 02/24/2023 Medical Rec #:  [id number]             Height:       76.0 in Accession #:    [id number]            Weight:       300.5 lb Date of Birth:  1960-06-01             BSA:          2.634 m Patient Age:    62 years              BP:           151/82 mmHg Patient Gender: M                     HR:           61 bpm. Exam Location:  Inpatient Procedure: 2D Echo, Cardiac Doppler and Color Doppler Indications:    Atrial fibrillation  History:        Patient has no prior history of Echocardiogram examinations.                 CAD; Risk Factors:Hypertension, Dyslipidemia and Sleep Apnea.  Sonographer:    Delcie Roch RDCS Referring Phys: [id number] SUBRINA SUNDIL IMPRESSIONS  1. Left ventricular ejection fraction, by estimation, is 60 to 65%. The left ventricle has normal function. The left ventricle has no regional wall motion abnormalities. Left ventricular diastolic parameters are indeterminate.  2. Right ventricular systolic function is normal. The right ventricular size is normal.  3. The mitral valve is normal in structure. No evidence of mitral valve regurgitation. No evidence of mitral stenosis.  4. The aortic valve is normal in structure. Aortic valve regurgitation is not visualized. No aortic stenosis is present.  5. The inferior vena cava is normal in size with greater than 50% respiratory variability, suggesting right atrial pressure of 3 mmHg. FINDINGS  Left Ventricle: Left ventricular ejection fraction, by estimation, is 60 to 65%. The left ventricle has normal function. The left ventricle has no regional wall motion abnormalities. Definity contrast agent was given IV to delineate the left ventricular  endocardial borders. The left ventricular internal cavity size was normal in size. There is no left ventricular hypertrophy. Left ventricular diastolic parameters are indeterminate. Right Ventricle: The right ventricular size is normal. No increase in right ventricular wall thickness. Right ventricular systolic function is normal. Left Atrium: Left atrial size was normal in size. Right Atrium: Right atrial size was normal in size. Pericardium: There is no evidence of pericardial effusion. Mitral Valve: The mitral valve is normal in structure. No evidence of mitral valve regurgitation. No evidence of mitral valve stenosis. Tricuspid Valve: The tricuspid valve is normal in structure. Tricuspid valve regurgitation is trivial. No evidence of tricuspid stenosis. Aortic Valve: The aortic valve is normal in structure. Aortic valve regurgitation is not visualized. No aortic stenosis is present.  Pulmonic Valve: The pulmonic valve was normal in structure. Pulmonic valve regurgitation is not visualized. No evidence of pulmonic stenosis. Aorta: The aortic root is normal in size and structure. Venous: The inferior vena cava is normal in size with greater than 50% respiratory variability, suggesting right atrial pressure of 3 mmHg. IAS/Shunts: No atrial level shunt detected by color flow Doppler.  LEFT VENTRICLE PLAX 2D LVIDd:         4.80 cm LVIDs:         3.40 cm LV PW:         1.10 cm LV IVS:        1.00 cm LVOT diam:     2.10 cm LV SV:         70 LV SV Index:   26 LVOT Area:     3.46 cm  RIGHT VENTRICLE             IVC RV Basal diam:  2.30 cm     IVC diam: 2.00 cm RV S prime:     13.20 cm/s TAPSE (M-mode): 1.9 cm LEFT ATRIUM           Index        RIGHT ATRIUM           Index LA diam:      3.20 cm 1.22 cm/m   RA Area:     13.80 cm LA Vol (A2C): 55.2 ml 20.96 ml/m  RA Volume:   32.40 ml  12.30 ml/m  AORTIC VALVE LVOT Vmax:   96.40 cm/s LVOT Vmean:  63.000 cm/s LVOT VTI:    0.201 m  AORTA Ao Root diam: 3.30 cm Ao Asc diam:  3.10 cm MITRAL VALVE MV Area (PHT): 3.48 cm    SHUNTS  MV Decel Time: 218 msec    Systemic VTI:  0.20 m MV E velocity: 87.30 cm/s  Systemic Diam: 2.10 cm MV A velocity: 65.60 cm/s MV E/A ratio:  1.33 Arvilla Meres MD Electronically signed by Arvilla Meres MD Signature Date/Time: 02/24/2023/10:06:12 AM    Final    CT Angio Chest PE W and/or Wo Contrast Result Date: 02/23/2023 CLINICAL DATA:  Pulmonary embolism (PE) suspected, high prob hx of factor V leiden and new onset AF with hx of DVT. Palpitations, shortness of breath EXAM: CT ANGIOGRAPHY CHEST WITH CONTRAST TECHNIQUE: Multidetector CT imaging of the chest was performed using the standard protocol during bolus administration of intravenous contrast. Multiplanar CT image reconstructions and MIPs were obtained to evaluate the vascular anatomy. RADIATION DOSE REDUCTION: This exam was performed according to the departmental  dose-optimization program which includes automated exposure control, adjustment of the mA and/or kV according to patient size and/or use of iterative reconstruction technique. CONTRAST:  75mL OMNIPAQUE IOHEXOL 350 MG/ML SOLN COMPARISON:  08/20/2021 FINDINGS: Cardiovascular: No filling defects in the pulmonary arteries to suggest pulmonary emboli. Heart is normal size. Aorta is normal caliber. Mediastinum/Nodes: No mediastinal, hilar, or axillary adenopathy. Trachea and esophagus are unremarkable. Thyroid unremarkable. Lungs/Pleura: Lungs are clear. No focal airspace opacities or suspicious nodules. No effusions. Upper Abdomen: No acute findings Musculoskeletal: Chest wall soft tissues are unremarkable. No acute bony abnormality. Review of the MIP images confirms the above findings. IMPRESSION: No evidence of pulmonary embolus. No acute cardiopulmonary disease. Electronically Signed   By: Charlett Nose M.D.   On: 02/23/2023 23:02   DG Chest Port 1 View Result Date: 02/23/2023 CLINICAL DATA:  Palpitations, shortness of breath EXAM: PORTABLE CHEST 1 VIEW COMPARISON:  12/23/2015 FINDINGS: Mild peribronchial thickening and interstitial prominence. No confluent opacities or effusions. Heart is normal size. Mediastinal contours within normal limits. No bony abnormality. IMPRESSION: Mild peribronchial thickening and interstitial prominence may reflect bronchitic changes or atypical infection. Electronically Signed   By: Charlett Nose M.D.   On: 02/23/2023 22:35    Procedures .Critical Care  Performed by: Derwood Kaplan, MD Authorized by: Derwood Kaplan, MD   Critical care provider statement:    Critical care time (minutes):  48   Critical care was necessary to treat or prevent imminent or life-threatening deterioration of the following conditions:  Circulatory failure   Critical care was time spent personally by me on the following activities:  Development of treatment plan with patient or surrogate,  discussions with consultants, evaluation of patient's response to treatment, examination of patient, ordering and review of laboratory studies, ordering and review of radiographic studies, ordering and performing treatments and interventions, pulse oximetry, re-evaluation of patient's condition, review of old charts and obtaining history from patient or surrogate     Medications Ordered in ED Medications  zolpidem (AMBIEN) tablet 5 mg (5 mg Oral Given 02/24/23 0318)  rivaroxaban (XARELTO) tablet 20 mg (20 mg Oral Given 02/24/23 0825)  rosuvastatin (CRESTOR) tablet 20 mg (20 mg Oral Given 02/24/23 0824)  sodium chloride flush (NS) 0.9 % injection 3 mL (3 mLs Intravenous Given 02/24/23 0825)  sodium chloride flush (NS) 0.9 % injection 3 mL (has no administration in time range)  0.9 %  sodium chloride infusion (has no administration in time range)  acetaminophen (TYLENOL) tablet 650 mg (has no administration in time range)    Or  acetaminophen (TYLENOL) suppository 650 mg (has no administration in time range)  senna-docusate (Senokot-S) tablet 1 tablet (has no administration in  time range)  ondansetron (ZOFRAN) tablet 4 mg (has no administration in time range)    Or  ondansetron (ZOFRAN) injection 4 mg (has no administration in time range)  traZODone (DESYREL) tablet 100 mg (100 mg Oral Given 02/24/23 0318)  diltiazem (CARDIZEM CD) 24 hr capsule 180 mg (180 mg Oral Given 02/24/23 0904)  perflutren lipid microspheres (DEFINITY) IV suspension (3 mLs Intravenous Given 02/24/23 0945)  iohexol (OMNIPAQUE) 350 MG/ML injection 100 mL (75 mLs Intravenous Contrast Given 02/23/23 2240)  diltiazem (CARDIZEM) 1 mg/mL load via infusion 20 mg (20 mg Intravenous Bolus from Bag 02/23/23 2353)    ED Course/ Medical Decision Making/ A&P                                 Medical Decision Making Amount and/or Complexity of Data Reviewed Labs: ordered. Radiology: ordered.  Risk Prescription drug  management. Decision regarding hospitalization.   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  He also has history of hypertension, likely sleep apnea.  He has been having palpitations off and on for the last month or so.  This episode was just not getting better.  Up until now, he was thinking that he was having PVCs as the episode will last for few seconds.  Differential diagnosis for this patient includes pulmonary embolism, valvular dose disorder, CHF, A-fib induced by longstanding hypertension or OSA or structural heart disease, stimulant use.  Plan is to get CT PE to rule out PE as the underlying cause for new onset A-fib.  If PE is ruled out, will start patient on diltiazem drip.  Given that patient thinks he missed a dose of Xarelto within the last 30 days and his history of factor V Leyden, it would be best to admit him to the hospital with rate control for now.  If however his rate controlled spontaneously, we will consider discharge.  Reassessment: CT PE is negative for PE. Dill drip ordered and admission request has been placed.  If patient's heart rate converted spontaneously, it is okay to discharge him.  Patient's care is being taken over by incoming team.  Final Clinical Impression(s) / ED Diagnoses Final diagnoses:  Atrial fibrillation with RVR (HCC)    Rx / DC Orders ED Discharge Orders          Ordered    diltiazem (CARDIZEM CD) 180 MG 24 hr capsule  Daily        02/24/23 1051    Increase activity slowly        02/24/23 1051    Diet - low sodium heart healthy        02/24/23 1051    Ambulatory referral to Cardiology        02/24/23 1051    Amb referral to AFIB Clinic        02/23/23 2208              Derwood Kaplan, MD 02/24/23 1518    Derwood Kaplan, MD 02/24/23 [phone]

## 2023-02-24 NOTE — ED Notes (Signed)
Pt transported to [facility]. Report called and given

## 2023-02-24 NOTE — Plan of Care (Signed)

## 2023-03-04 ENCOUNTER — Encounter: Payer: Self-pay | Admitting: Nurse Practitioner

## 2023-03-04 ENCOUNTER — Telehealth: Payer: Self-pay

## 2023-03-04 DIAGNOSIS — M5126 Other intervertebral disc displacement, lumbar region: Secondary | ICD-10-CM

## 2023-03-04 DIAGNOSIS — M546 Pain in thoracic spine: Secondary | ICD-10-CM

## 2023-03-04 MED ORDER — CYCLOBENZAPRINE HCL 10 MG PO TABS: 10.0000 mg | ORAL_TABLET | Freq: Two times a day (BID) | ORAL | 0 refills | Status: DC | PRN

## 2023-03-04 MED ORDER — METHYLPREDNISOLONE 4 MG PO TBPK: ORAL_TABLET | ORAL | 1 refills | Status: DC

## 2023-03-04 NOTE — Telephone Encounter (Signed)
 Medication has been sent for him. Please let him know.

## 2023-03-04 NOTE — Telephone Encounter (Signed)
 Pt. Walked in wanted to know if there was anyway you could help him out until he gets his shot for his back at the neurosurgeons office. He said he has been waiting for it to get approved for a few weeks now and is in horrible pain with his back. He wanted to know if you could call him in something for pain for a week or two should be approved for his shot by then. He has taken flexerill, prednisone  and baclofen  now of which seemed to help a whole lot. He said the MRI showed he had two slipped disc in his back causing the horrible pain.

## 2023-03-05 MED ORDER — HYDROCODONE-ACETAMINOPHEN 5-325 MG PO TABS: 1.0000 | ORAL_TABLET | Freq: Four times a day (QID) | ORAL | 0 refills | Status: AC | PRN

## 2023-03-08 ENCOUNTER — Encounter (HOSPITAL_BASED_OUTPATIENT_CLINIC_OR_DEPARTMENT_OTHER): Payer: Self-pay | Admitting: Cardiology

## 2023-03-08 ENCOUNTER — Ambulatory Visit (INDEPENDENT_AMBULATORY_CARE_PROVIDER_SITE_OTHER): Admitting: Cardiology

## 2023-03-08 VITALS — BP 140/86 | HR 78 | Resp 16 | Ht 76.0 in | Wt 298.0 lb

## 2023-03-08 DIAGNOSIS — E119 Type 2 diabetes mellitus without complications: Secondary | ICD-10-CM

## 2023-03-08 DIAGNOSIS — I2510 Atherosclerotic heart disease of native coronary artery without angina pectoris: Secondary | ICD-10-CM

## 2023-03-08 DIAGNOSIS — E7801 Familial hypercholesterolemia: Secondary | ICD-10-CM

## 2023-03-08 DIAGNOSIS — I10 Essential (primary) hypertension: Secondary | ICD-10-CM

## 2023-03-08 DIAGNOSIS — Z09 Encounter for follow-up examination after completed treatment for conditions other than malignant neoplasm: Secondary | ICD-10-CM

## 2023-03-08 DIAGNOSIS — I480 Paroxysmal atrial fibrillation: Secondary | ICD-10-CM

## 2023-03-08 MED ORDER — DILTIAZEM HCL 30 MG PO TABS: 30.0000 mg | ORAL_TABLET | ORAL | 11 refills | Status: AC | PRN

## 2023-03-08 MED ORDER — ROSUVASTATIN CALCIUM 20 MG PO TABS: 20.0000 mg | ORAL_TABLET | Freq: Every day | ORAL | 3 refills | Status: DC

## 2023-03-08 NOTE — Patient Instructions (Addendum)
 Medication Instructions:  Your physician has recommended you make the following change in your medication:   CHANGE Diltiazem  to as needed. (If afib occurs)  *If you need a refill on your cardiac medications before your next appointment, please call your pharmacy*   Lab Work: Your physician recommends the following labs today: HgbA1C, LPa and Lipids  If you have labs (blood work) drawn today and your tests are completely normal, you will receive your results only by: MyChart Message (if you have MyChart) OR A paper copy in the mail If you have any lab test that is abnormal or we need to change your treatment, we will call you to review the results.   Testing/Procedures: NONE   Follow-Up: At [facility], you and your health needs are our priority.  As part of our continuing mission to provide you with exceptional heart care, we have created designated Provider Care Teams.  These Care Teams include your primary Cardiologist (physician) and Advanced Practice Providers (APPs -  Physician Assistants and Nurse Practitioners) who all work together to provide you with the care you need, when you need it.  We recommend signing up for the patient portal called MyChart.  Sign up information is provided on this After Visit Summary.  MyChart is used to connect with patients for Virtual Visits (Telemedicine).  Patients are able to view lab/test results, encounter notes, upcoming appointments, etc.  Non-urgent messages can be sent to your provider as well.   To learn more about what you can do with MyChart, go to [email or web address].    Your next appointment:   3 month(s)  Provider:   Shelda Bruckner, MD or Reche Finder, NP    Other Instructions    - Don't start long acting diltiazem  (180 mg). We will use as needed short acting diltiazem  for if afib recurs. - Labs today (lipids, A1c) - Keep working on lifestyle, we will talk about repeat sleep study at your follow up -  Check your BP at home; I think pain may be causing this, but if your blood pressure is still high after the epidural injection, call me or send me a note and we can talk about medication options.  how to check blood pressure:  -sit comfortably in a chair, feet uncrossed and flat on floor, for 5-10 minutes  -arm ideally should rest at the level of the heart. However, arm should be relaxed and not tense (for example, do not hold the arm up unsupported)  -avoid exercise, caffeine, and tobacco for at least 30 minutes prior to BP reading  -don't take BP cuff reading over clothes (always place on skin directly)  -I prefer to know how well the medication is working, so I would like you to take your readings 1-2 hours after taking your blood pressure medication if possible  - We will check your lipoprotein (a) (lp little a) levels today as well. If these are high, it's genetic, and your children/siblings should also be tested.

## 2023-03-08 NOTE — Progress Notes (Signed)
 Cardiology Office Note:  .   Date:  03/08/2023  ID:  Dean Knox, DOB [birth year], MRN [id number] PCP: Dean Camie BRAVO, NP  [facility] Providers Cardiologist:  Dean Bruckner, MD {  History of Present Illness: .   Dean Knox is a 63 y.o. male with PMH paroxysmal atrial fibrillation diagnosed 01/2023, nonobstructive CAD, type II diabetes, hypertension, hypercholesterolemia, OSA not using CPAP, history of reported heart failure, history of DVT/factor 5 leiden who is seen for follow-up. He was initially seen 07/14/2021 as a new consult at the request of Early, Camie BRAVO, NP for the evaluation and management of cardiovascular risk.   History: Retired from the [employer] >10 years ago, was a copywriter, advertising. After retirement, put on 110 lbs in 5 mos. Had a doctor tell him he was in heart failure. Lived in [place], [place] at the time. Had lost weight intentionally since then. Never required hospitalization. Had rare ankle swelling, with lasix as needed. Mother had varicose veins. On Xarelto  long term for factor 5 leiden, history of DVT.  He had previously avoided statins due to concern for side effects. We reviewed his lipids. Recent Tchol 286, HDL 62, LDL 195, TG 159 (not fasting). He reported rare chest pain with no clear triggers. His acid reflux resolved since having Nissen procedure.   He had a Coronary CT 07/2021 which revealed mild CAD in first diagonal branch, 25-49% stenosis, CADRADS 2. His coronary calcium  score is 22.1, 52nd percentile for age and sex matched control. Mild dilation of main pulmonary artery, 31 mm, which may indicate increased pulmonary pressure. Started statin after this.  Today: Admitted to the hospital 02/23/23 with atrial fibrillation with RVR. He spontaneously converted to sinus rhythm.This was a new diagnosis for afib. Echo largely unremarkable, CTPE negative. Was already on Xarelto  for history of factor 5 leiden.   Hasn't taken cardizem  yet  since discharge. Felt that his PVCs increased about 2 mos ago, but these were brief and self limited. Event on Christmas Eve was the first sustained event he had.   Since discharge, has ben drinking more water, dieting (has lost 10 lbs). Has noted increased alcohol intake with the holidays, was drinking 304 white claws and some vodka (4 shots) daily in the evening;none since his hospitalization. Takes adderall, drinks 2 cups of coffee in the AM, this has been stable.   BP is up; having a lot of pain in his back, pending epidural injection (holding Xarelto  prior to this). Has BP cuff at home; uses intermittently. Has been higher in the last two months.  We discussed OSA; he was recommended for repeat sleep study. Wants to work on lifestyle first before repeating this. Wsa on CPAP in the distant past, but then lost weight and sleep apnea improved. Working to lose the 40 lbs he has gained since then.  ROS: Denies chest pain, shortness of breath at rest or with normal exertion. No PND, orthopnea, LE edema or unexpected weight gain. No syncope or palpitations. ROS otherwise negative except as noted.   Studies Reviewed: SABRA    EKG:  EKG Interpretation Date/Time:  Monday March 08 2023 10:57:33 EST Ventricular Rate:  72 PR Interval:  154 QRS Duration:  96 QT Interval:  380 QTC Calculation: 416 R Axis:   -51  Text Interpretation: Normal sinus rhythm with sinus arrhythmia Left anterior fascicular block Minimal voltage criteria for LVH, may be normal variant Confirmed by Knox Dean [phone]) on 03/08/2023 11:35:24 AM  Physical Exam:   VS:  BP (!) 140/86   Pulse 78   Resp 16   Ht 6' 4 (1.93 m)   Wt 298 lb (135.2 kg)   SpO2 95%   BMI 36.27 kg/m    Wt Readings from Last 3 Encounters:  03/08/23 298 lb (135.2 kg)  02/24/23 (!) 300 lb 7.8 oz (136.3 kg)  12/21/22 (!) 301 lb 4.8 oz (136.7 kg)    GEN: Well nourished, well developed in no acute distress HEENT: Normal, moist mucous  membranes NECK: No JVD CARDIAC: regular rhythm, normal S1 and S2, no rubs or gallops. No murmur. VASCULAR: Radial and DP pulses 2+ bilaterally. No carotid bruits RESPIRATORY:  Clear to auscultation without rales, wheezing or rhonchi  ABDOMEN: Soft, non-tender, non-distended MUSCULOSKELETAL:  Ambulates independently SKIN: Warm and dry, no edema NEUROLOGIC:  Alert and oriented x 3. No focal neuro deficits noted. PSYCHIATRIC:  Normal affect    ASSESSMENT AND PLAN: .    14 day post hospital follow up for admission for afib Paroxysmal atrial fibrillation -new diagnosis 01/2023; hospital course reviewed, medication reconciliation done today -CHA2DS2/VAS Stroke Risk Points=6  -on Xarelto , has history of DVT/factor 5 leiden -thyroid  normal, electrolytes normal in the hospital -discussed alcohol recommendations; was drinking heavily prior to his afib, has been dry since -discussed OSA, wants to work on lifestyle/weight loss before rechecking sleep study. Would be interested in Inspire device -stop standing diltiazem , change to PRN for fast events.   Nonobstructive CAD Elevated LDL, consistent with heterozygous familial hypercholesterolemia -LDL goal <55 given T2D, last 47 a year ago -recheck today, he is fasting -counseled on red flag warning signs that need immediate medical attention -check lp(a)   Hypertension -has been well controlled until recently; in a lot of pain today -continue losartan , furosemide -discussed home monitoring   Type II diabetes -given CAD, recommend SLGT2i if medication needed -last A1c 6.2, recheck today   CV risk counseling and prevention -recommend heart healthy/Mediterranean diet, with whole grains, fruits, vegetable, fish, lean meats, nuts, and olive oil. Limit salt. -recommend moderate walking, 3-5 times/week for 30-50 minutes each session. Aim for at least 150 minutes.week. Goal should be pace of 3 miles/hours, or walking 1.5 miles in 30  minutes -recommend avoidance of tobacco products. Avoid excess alcohol.  Dispo: 3 mos or sooner as needed  Signed, Dean Bruckner, MD   Dean Bruckner, MD, PhD, [facility]  [facility]  [place]  [facility] [address] [place], [place] [address] [phone]

## 2023-03-09 ENCOUNTER — Encounter (HOSPITAL_BASED_OUTPATIENT_CLINIC_OR_DEPARTMENT_OTHER): Payer: Self-pay

## 2023-03-09 DIAGNOSIS — E7801 Familial hypercholesterolemia: Secondary | ICD-10-CM

## 2023-03-09 LAB — LIPID PANEL
Chol/HDL Ratio: 3.2 {ratio} (ref 0.0–5.0)
Cholesterol, Total: 140 mg/dL (ref 100–199)
HDL: 44 mg/dL (ref >39)
LDL Chol Calc (NIH): 76 mg/dL (ref 0–99)
Triglycerides: 110 mg/dL (ref 0–149)
VLDL Cholesterol Cal: 20 mg/dL (ref 5–40)

## 2023-03-09 LAB — HEMOGLOBIN A1C
Est. average glucose Bld gHb Est-mCnc: 143 mg/dL
Hgb A1c MFr Bld: 6.6 % — ABNORMAL HIGH (ref 4.8–5.6)

## 2023-03-09 LAB — LIPOPROTEIN A (LPA): Lipoprotein (a): <8.4 nmol/L (ref <75.0)

## 2023-03-29 ENCOUNTER — Encounter: Payer: Self-pay | Admitting: Nurse Practitioner

## 2023-03-30 ENCOUNTER — Other Ambulatory Visit: Payer: Self-pay | Admitting: Nurse Practitioner

## 2023-03-30 DIAGNOSIS — F902 Attention-deficit hyperactivity disorder, combined type: Secondary | ICD-10-CM

## 2023-03-30 MED ORDER — AMPHETAMINE-DEXTROAMPHETAMINE 30 MG PO TABS: 30.0000 mg | ORAL_TABLET | Freq: Two times a day (BID) | ORAL | 0 refills | Status: DC

## 2023-06-14 ENCOUNTER — Ambulatory Visit (HOSPITAL_BASED_OUTPATIENT_CLINIC_OR_DEPARTMENT_OTHER): Admitting: Cardiology

## 2023-07-05 ENCOUNTER — Other Ambulatory Visit: Payer: Self-pay | Admitting: Nurse Practitioner

## 2023-07-05 DIAGNOSIS — F332 Major depressive disorder, recurrent severe without psychotic features: Secondary | ICD-10-CM

## 2023-07-05 DIAGNOSIS — F5104 Psychophysiologic insomnia: Secondary | ICD-10-CM

## 2023-07-05 DIAGNOSIS — F4001 Agoraphobia with panic disorder: Secondary | ICD-10-CM

## 2023-07-05 DIAGNOSIS — F4312 Post-traumatic stress disorder, chronic: Secondary | ICD-10-CM

## 2023-07-05 NOTE — Telephone Encounter (Signed)
 Last apt 12/21/22.

## 2023-07-22 ENCOUNTER — Encounter: Payer: Self-pay | Admitting: Nurse Practitioner

## 2023-07-22 DIAGNOSIS — F900 Attention-deficit hyperactivity disorder, predominantly inattentive type: Secondary | ICD-10-CM

## 2023-07-23 MED ORDER — AMPHETAMINE-DEXTROAMPHET ER 30 MG PO CP24: 30.0000 mg | ORAL_CAPSULE | ORAL | 0 refills | Status: DC

## 2023-11-23 ENCOUNTER — Other Ambulatory Visit: Payer: Self-pay | Admitting: Nurse Practitioner

## 2023-11-23 DIAGNOSIS — F4001 Agoraphobia with panic disorder: Secondary | ICD-10-CM

## 2023-11-23 DIAGNOSIS — F900 Attention-deficit hyperactivity disorder, predominantly inattentive type: Secondary | ICD-10-CM

## 2023-11-23 DIAGNOSIS — F332 Major depressive disorder, recurrent severe without psychotic features: Secondary | ICD-10-CM

## 2023-11-23 DIAGNOSIS — F5104 Psychophysiologic insomnia: Secondary | ICD-10-CM

## 2023-11-23 DIAGNOSIS — F4312 Post-traumatic stress disorder, chronic: Secondary | ICD-10-CM

## 2023-11-23 DIAGNOSIS — I10 Essential (primary) hypertension: Secondary | ICD-10-CM

## 2023-11-23 NOTE — Telephone Encounter (Signed)
 Last apt 12/21/22.

## 2023-11-24 MED ORDER — LOSARTAN POTASSIUM 100 MG PO TABS: 100.0000 mg | ORAL_TABLET | Freq: Every day | ORAL | 0 refills | Status: DC

## 2023-11-24 NOTE — Addendum Note (Signed)
 Addended by: VICCI HUSBAND A on: 11/24/2023 02:35 PM   Modules accepted: Orders

## 2023-11-24 NOTE — Telephone Encounter (Signed)
 Patient came by and needed a 30 day supply of losartan  as he did not have any 100mg  left and [facility] . I sent in 30 days.   Pt needs a refill on his Adderall

## 2023-11-25 MED ORDER — AMPHETAMINE-DEXTROAMPHET ER 30 MG PO CP24: 30.0000 mg | ORAL_CAPSULE | ORAL | 0 refills | Status: AC

## 2023-11-25 NOTE — Addendum Note (Signed)
 Addended by: Sky Primo, CAMIE E on: 11/25/2023 07:49 AM   Modules accepted: Orders

## 2023-11-26 ENCOUNTER — Other Ambulatory Visit: Payer: Self-pay | Admitting: Nurse Practitioner

## 2023-11-26 DIAGNOSIS — I10 Essential (primary) hypertension: Secondary | ICD-10-CM

## 2023-11-26 DIAGNOSIS — F332 Major depressive disorder, recurrent severe without psychotic features: Secondary | ICD-10-CM

## 2023-11-26 DIAGNOSIS — F4312 Post-traumatic stress disorder, chronic: Secondary | ICD-10-CM

## 2023-11-26 DIAGNOSIS — F4001 Agoraphobia with panic disorder: Secondary | ICD-10-CM

## 2023-11-26 DIAGNOSIS — F900 Attention-deficit hyperactivity disorder, predominantly inattentive type: Secondary | ICD-10-CM

## 2023-11-26 DIAGNOSIS — F5104 Psychophysiologic insomnia: Secondary | ICD-10-CM

## 2023-11-26 NOTE — Telephone Encounter (Signed)
 Patient would like to know If you could send in refills for the losartan . You have not prescribed this for him before and when he came in the other day, the [facility] would not refill this due to them having him at 50mg , so I refilled for 30 days emergency for him as he was out and needed it refilled. Please advise

## 2023-11-26 NOTE — Telephone Encounter (Signed)
   Already refilled on 11/24/23

## 2023-12-10 ENCOUNTER — Ambulatory Visit: Admitting: Nurse Practitioner

## 2023-12-17 ENCOUNTER — Encounter: Payer: Self-pay | Admitting: Nurse Practitioner

## 2023-12-17 ENCOUNTER — Ambulatory Visit: Admitting: Nurse Practitioner

## 2023-12-17 VITALS — BP 122/84 | HR 80 | Wt 302.8 lb

## 2023-12-17 DIAGNOSIS — R7303 Prediabetes: Secondary | ICD-10-CM

## 2023-12-17 DIAGNOSIS — F332 Major depressive disorder, recurrent severe without psychotic features: Secondary | ICD-10-CM

## 2023-12-17 DIAGNOSIS — M62830 Muscle spasm of back: Secondary | ICD-10-CM

## 2023-12-17 DIAGNOSIS — I10 Essential (primary) hypertension: Secondary | ICD-10-CM

## 2023-12-17 DIAGNOSIS — I4891 Unspecified atrial fibrillation: Secondary | ICD-10-CM

## 2023-12-17 DIAGNOSIS — E782 Mixed hyperlipidemia: Secondary | ICD-10-CM | POA: Diagnosis not present

## 2023-12-17 DIAGNOSIS — M546 Pain in thoracic spine: Secondary | ICD-10-CM

## 2023-12-17 DIAGNOSIS — F4001 Agoraphobia with panic disorder: Secondary | ICD-10-CM

## 2023-12-17 DIAGNOSIS — Z86718 Personal history of other venous thrombosis and embolism: Secondary | ICD-10-CM

## 2023-12-17 DIAGNOSIS — I509 Heart failure, unspecified: Secondary | ICD-10-CM

## 2023-12-17 DIAGNOSIS — Z125 Encounter for screening for malignant neoplasm of prostate: Secondary | ICD-10-CM

## 2023-12-17 DIAGNOSIS — F4312 Post-traumatic stress disorder, chronic: Secondary | ICD-10-CM

## 2023-12-17 DIAGNOSIS — F900 Attention-deficit hyperactivity disorder, predominantly inattentive type: Secondary | ICD-10-CM

## 2023-12-17 DIAGNOSIS — F5104 Psychophysiologic insomnia: Secondary | ICD-10-CM

## 2023-12-17 IMAGING — CT CT ABD-PELV W/ CM
2 of 5 series · 16 of 46 positions shown, 18 images · IV contrast (omnipaque)
Comparison: May 17, 2020

CLINICAL DATA: History of diverticulitis. Right-sided abdominal
pain

EXAM:
CT ABDOMEN AND PELVIS WITH CONTRAST
TECHNIQUE: Multidetector CT imaging of the abdomen and pelvis was performed
using the standard protocol following bolus administration of
intravenous contrast.
RADIATION DOSE REDUCTION: This exam was performed according to the
departmental dose-optimization program which includes automated
exposure control, adjustment of the mA and/or kV according to
patient size and/or use of iterative reconstruction technique.
CONTRAST:  100 mL of Omnipaque 300

[Series 2: abd pel w · axial · 0.94mm/px · z∈[-320,+165]mm · 13 of 111 slices shown, 15 images]
[im 7/111  soft-tissue]
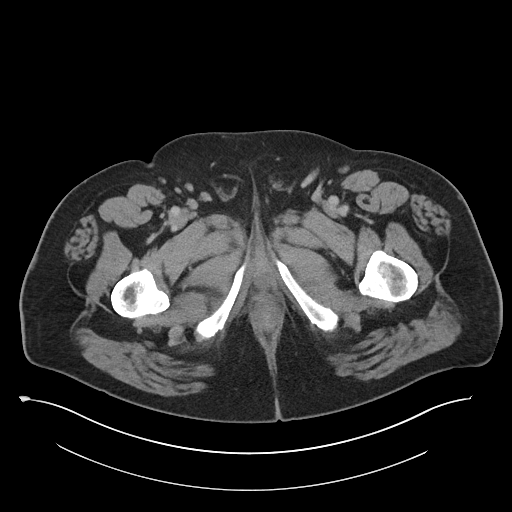
[im 7/111  bone]
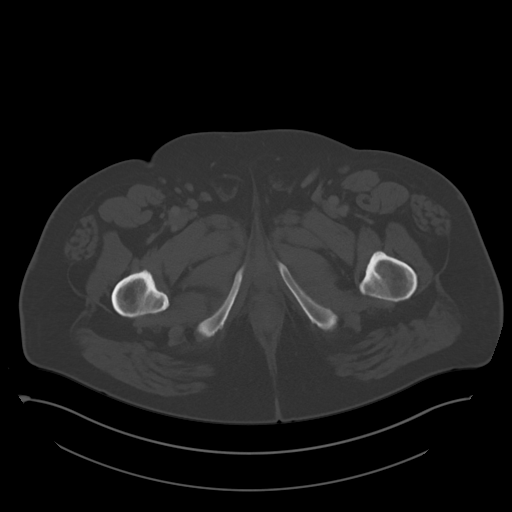
[im 13/111  soft-tissue]
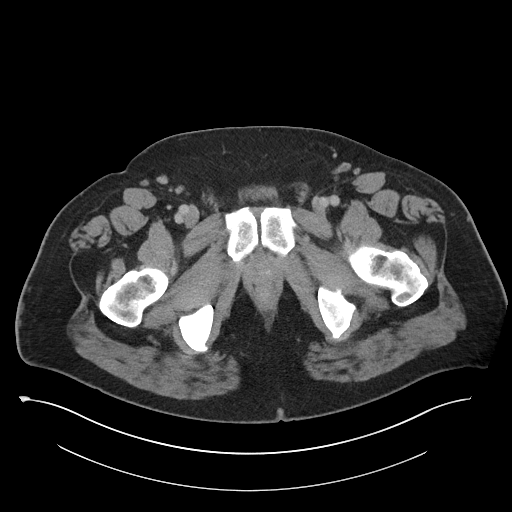
[im 26/111  soft-tissue]
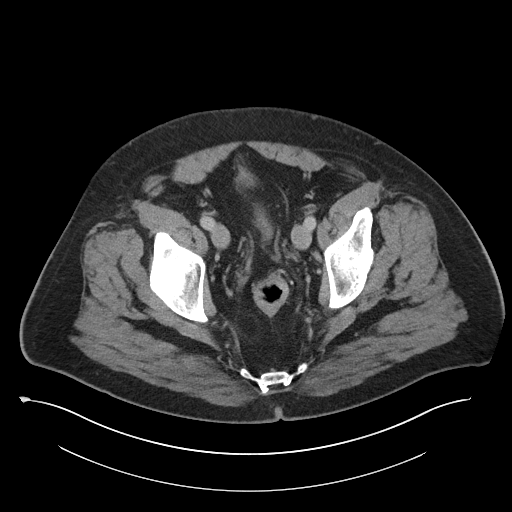
[im 33/111  soft-tissue]
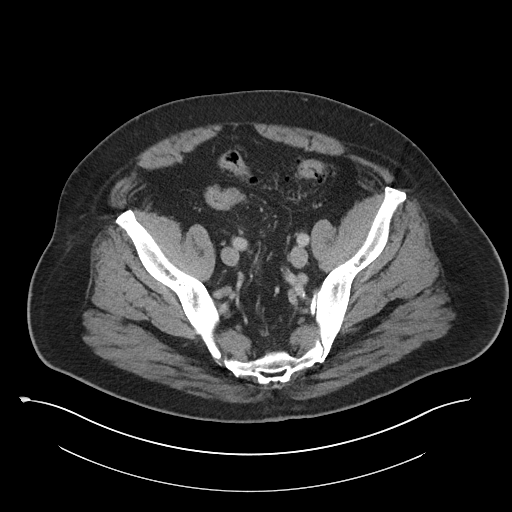
[im 39/111  soft-tissue]
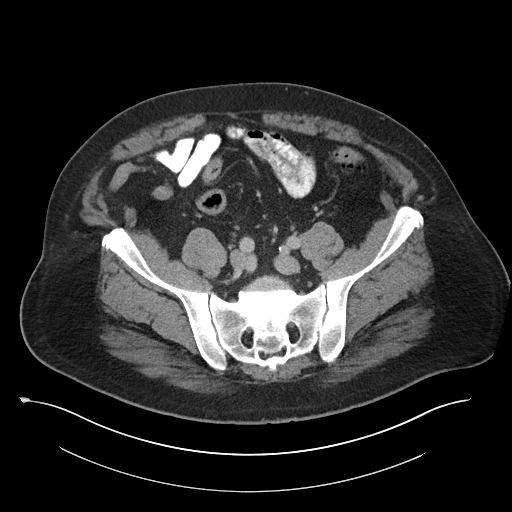
[im 46/111  soft-tissue]
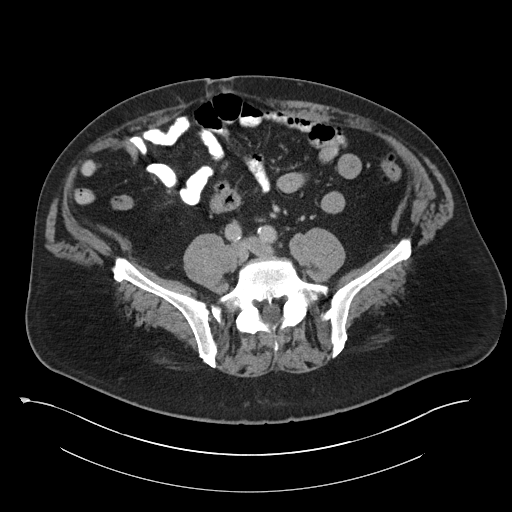
[im 59/111  soft-tissue]
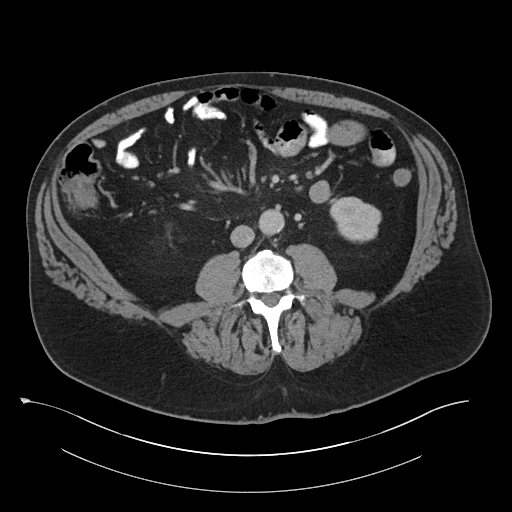
[im 65/111  soft-tissue]
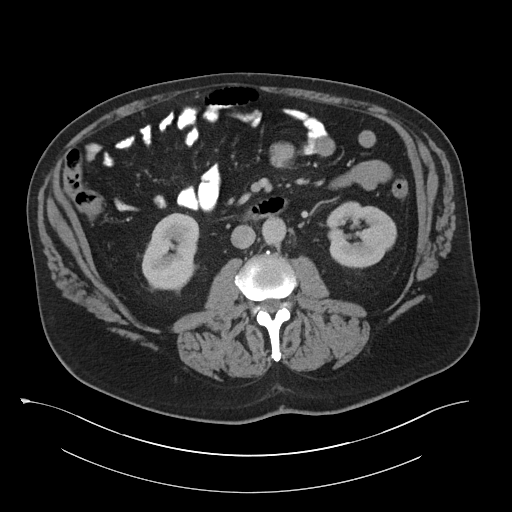
[im 72/111  soft-tissue]
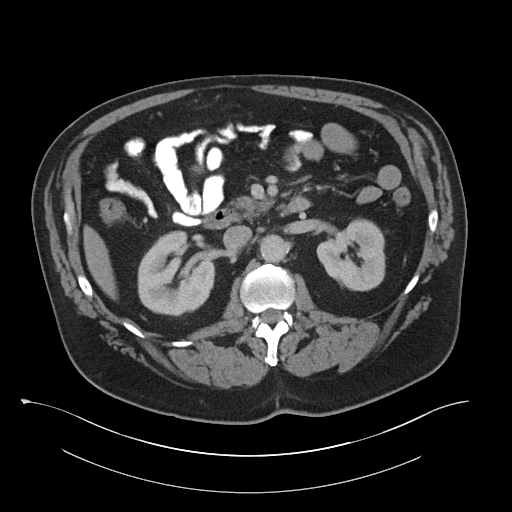
[im 72/111  bone]
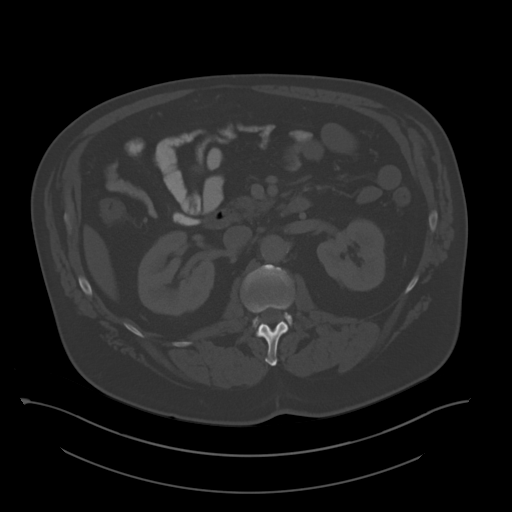
[im 78/111  soft-tissue]
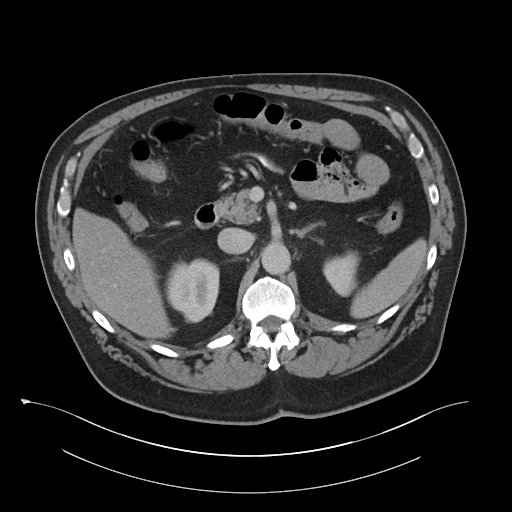
[im 85/111  soft-tissue]
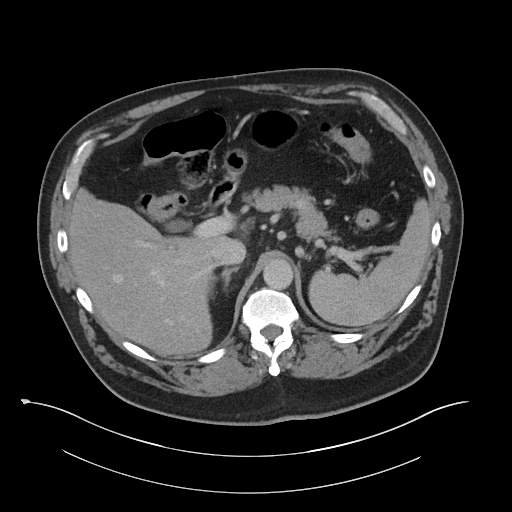
[im 98/111  soft-tissue]
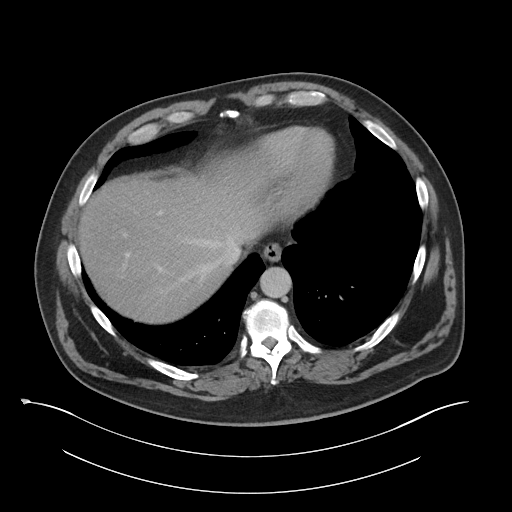
[im 104/111  soft-tissue]
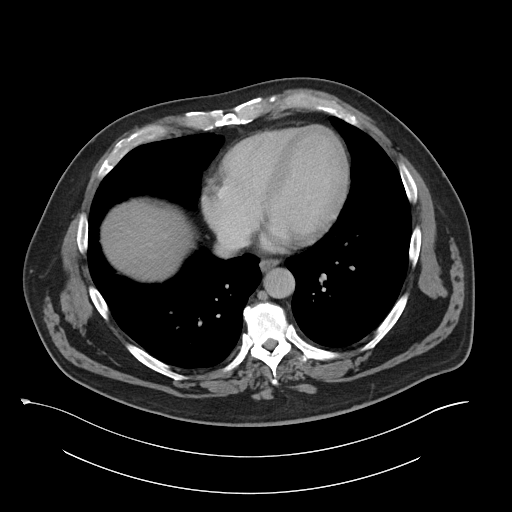

[Series 5: coronal · coronal · 0.92mm/px · 3 of 120 slices shown]
[im 40/120  soft-tissue]
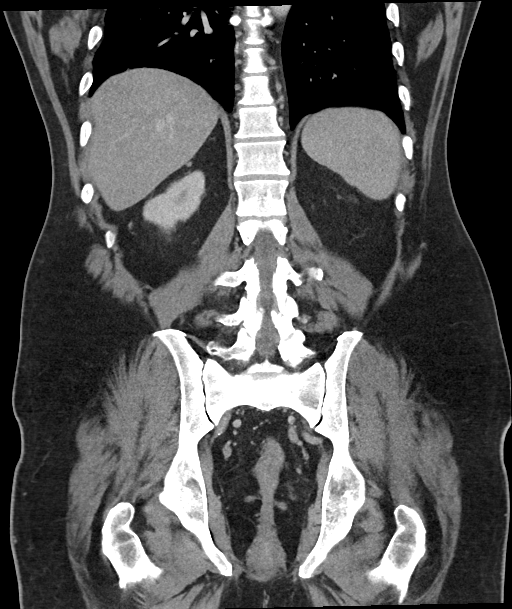
[im 53/120  soft-tissue]
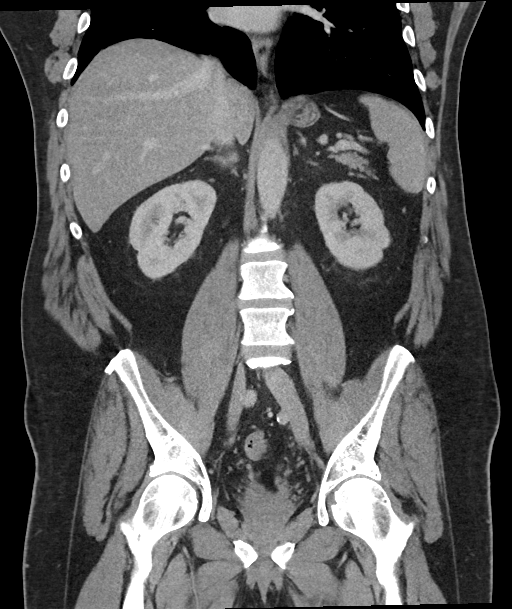
[im 67/120  soft-tissue]
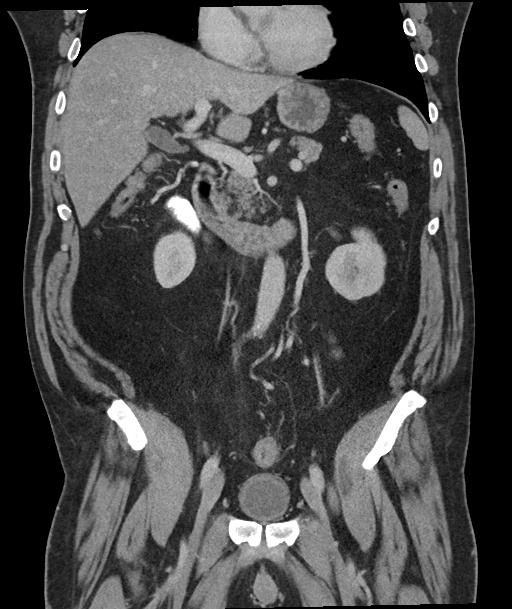

[16 of 46 positions shown; findings below may reference images not displayed]

FINDINGS: Lower chest: Unremarkable

Hepatobiliary: Liver has a normal appearance. No mass or
intrahepatic biliary dilatation. Gallbladder is unremarkable.

Pancreas: Has a normal appearance.

Spleen: Unremarkable

Adrenals/Urinary Tract: Adrenals have a normal appearance. Kidneys
are unremarkable without a nephroureterolithiasis or
hydroureteronephrosis.

Stomach/Bowel: Bowel-gas pattern has a normal appearance. Moderately
severe diverticulosis of the sigmoid colon without diverticulitis.
Appendix is not identified and is likely surgically absent. No
inflammatory stranding of the pericecal area.

Vascular/Lymphatic: Minimal atheromatous calcifications. No
abdominal aortic aneurysm.

Reproductive: Unremarkable

Musculoskeletal: Mild thoracolumbar spondylosis.
IMPRESSION: Moderately severe sigmoid diverticulosis without diverticulitis. No
free air, fluid, inflammatory changes, mass or significant
lymphadenopathy. Bowel-gas pattern is nonobstructive.

## 2023-12-17 MED ORDER — LOSARTAN POTASSIUM 100 MG PO TABS
100.0000 mg | ORAL_TABLET | Freq: Every day | ORAL | 3 refills | Status: AC
Start: 2023-12-17 — End: ?

## 2023-12-17 MED ORDER — TRAMADOL HCL 50 MG PO TABS: 50.0000 mg | ORAL_TABLET | Freq: Three times a day (TID) | ORAL | 0 refills | Status: AC | PRN

## 2023-12-17 MED ORDER — BACLOFEN 20 MG PO TABS: 20.0000 mg | ORAL_TABLET | Freq: Three times a day (TID) | ORAL | 0 refills | Status: AC

## 2023-12-17 MED ORDER — DIAZEPAM 5 MG PO TABS: ORAL_TABLET | ORAL | 0 refills | Status: DC

## 2023-12-17 MED ORDER — METHYLPREDNISOLONE 4 MG PO TBPK: ORAL_TABLET | ORAL | 1 refills | Status: DC

## 2023-12-17 NOTE — Progress Notes (Signed)
 Catheline Doing, DNP, AGNP-c [facility]  [address] [place], [place] [address] [phone]  ESTABLISHED PATIENT- Chronic Health and/or Follow-Up Visit on 12/17/2023  Blood pressure 122/84, pulse 80, weight (!) 302 lb 12.8 oz (137.3 kg).   HPI: History of Present Illness Dean Knox is a 63 year old male with chronic back pain who presents with worsening thoracic back pain and muscle spasms.  He has experienced a significant worsening of his chronic back pain over the past three weeks, with the last few days being particularly intolerable. The pain is described as a pulling sensation in the thoracic region, making it difficult to breathe and perform daily activities. Movement exacerbates the pain, although there are times during the day when it becomes more tolerable.  He has a history of undergoing an epidural procedure, which provided minimal relief. The pain never fully subsided after the procedure and has progressively worsened over the past month. He has also tried a TENS unit without success.  He is currently taking methylprednisolone , having completed two packs, but it has not alleviated his symptoms. He has been using Flexeril  and trazodone  to aid with pain to allow him to rest, but these have not been effective in providing relief. He mentions gaining 25 pounds due to inactivity caused by the pain.  He describes the sensation in his back as a 'big knot' and a muscle spasm that feels like a rock, which has not responded to dry needling performed by his sister, a physical therapist. Ice initially helped, but now provides no relief. He has not tried heat recently but has a hot tub at home.  He reports swelling in his feet during the day, which resolves at night, attributing this to his diet. He also mentions a history of diverticulitis, which flared up in the summer but has since resolved.  His current medications include Xarelto  for Factor V  Leiden, losartan  for hypertension, and Adderall for ADHD. He also uses Xanax  as needed, although he rarely takes it.  All ROS negative with exception of what is listed above.    PHYSICAL EXAM Physical Exam Vitals and nursing note reviewed.  Constitutional:      Appearance: He is ill-appearing. He is not toxic-appearing or diaphoretic.  HENT:     Head: Normocephalic.  Eyes:     Pupils: Pupils are equal, round, and reactive to light.  Cardiovascular:     Rate and Rhythm: Normal rate and regular rhythm.     Pulses: Normal pulses.     Heart sounds: Normal heart sounds.  Pulmonary:     Effort: Pulmonary effort is normal.     Breath sounds: Normal breath sounds.  Abdominal:     Palpations: Abdomen is soft.  Musculoskeletal:        General: Swelling and tenderness present.       Arms:     Cervical back: No rigidity.     Thoracic back: Swelling, spasms and tenderness present. Decreased range of motion.     Lumbar back: Tenderness present. Negative right straight leg raise test and negative left straight leg raise test.  Skin:    General: Skin is warm and dry.     Capillary Refill: Capillary refill takes less than 2 seconds.  Neurological:     Mental Status: He is alert and oriented to person, place, and time.     Motor: Weakness present.     Coordination: Coordination abnormal.     Gait: Gait abnormal.  Psychiatric:  Mood and Affect: Mood normal.     PLAN Problem List Items Addressed This Visit     ADHD   Chronic ADHD with adderall for management. He is tolerating his current dose well and does not feel this is exacerbating his anxiety, but rather helps to keep it better controlled. Will plan to continue at current dose and monitor. Repeat evaluation in 6 months.       Relevant Medications   losartan  (COZAAR ) 100 MG tablet   Essential hypertension   Blood pressure is well-controlled despite severe pain, which is surprising given the current level of discomfort. -  Continue current antihypertensive regimen with losartan .      Relevant Medications   losartan  (COZAAR ) 100 MG tablet   Chronic post-traumatic stress disorder (PTSD)   Patient has requested a refill of their as-needed anxiety medication. He is not taking his xanax  daily, but very intermittently. At this time his anxiety symptoms are well controlled. He feels the current dose is effective.  Plan:  Refill Xanax  PRN to manage anxiety symptoms as required.      Relevant Medications   diazepam (VALIUM) 5 MG tablet   losartan  (COZAAR ) 100 MG tablet   Insomnia   Chronic insomnia associated with anxiety disorder. Currently managing with Ambien  PRN when trazodone  is not effective. No alarm symptoms present at this time. Will continue to monitor.       Relevant Medications   losartan  (COZAAR ) 100 MG tablet   Hyperlipidemia   Chronic condition managed with cardiology. Rosuvastatin  for management with no concerns at this time. Labs can be completed next week when fasting.  - Continue rosuvastatin  - fasting labs in the next week or so      Relevant Medications   losartan  (COZAAR ) 100 MG tablet   Panic disorder with agoraphobia   Longstanding history of panic disorder and PTSD. He is currently managed with PRN alprazolam  and trazodone  at bedtime with ambien  if trazodone  is not effective. He is aware not to take ambien  and alprazolam  at the same time. No acute exacerbation at this time. Recommend close monitoring for worsening symptoms and report immediately.       Relevant Medications   diazepam (VALIUM) 5 MG tablet   losartan  (COZAAR ) 100 MG tablet   Recurrent major depression   Symptoms stable at this time with no concerns. No changes to plan of care.       Relevant Medications   diazepam (VALIUM) 5 MG tablet   losartan  (COZAAR ) 100 MG tablet   Back pain   Chronic thoracic back pain with acute exacerbation over the last three weeks, characterized by severe muscle spasms and disc  protrusions at T8-T9. Previous treatments including epidural injections and physical therapy with dry needling have been ineffective. Current symptoms include severe muscle spasm, difficulty breathing, and inability to perform daily activities. MRI shows small right paracentral disc protrusion at T8-T9 contacting the spinal cord, with no stenosis. Current medications include methylprednisolone , which has provided limited relief. - Administer steroid injection intramuscularly to provide stronger anti-inflammatory effect. - Prescribe baclofen  as a central acting muscle relaxant, advising not to take with Flexeril . - Prescribe Valium for muscle relaxation, advising to test tolerance before combining with baclofen . - Prescribe tramadol for pain management. - Refer to a orthopedist for further evaluation and potential alternative treatments. - Advise use of heat therapy instead of ice to alleviate muscle spasm.      Relevant Medications   baclofen  (LIORESAL ) 20 MG tablet   traMADol (  ULTRAM) 50 MG tablet   Atrial fibrillation with RVR (HCC)   Relevant Medications   losartan  (COZAAR ) 100 MG tablet   Spasm of thoracic back muscle - Primary   Relevant Medications   baclofen  (LIORESAL ) 20 MG tablet   diazepam (VALIUM) 5 MG tablet   traMADol (ULTRAM) 50 MG tablet   Other Relevant Orders   Ambulatory referral to Orthopedic Surgery   Pre-diabetes   Diet managed with no concerning symptoms at this time. I suspect his blood sugar will be elevated today as he has been on steroids for his back pain. We discussed checking the labs in 3 months, but he would like to go ahead and see where his levels are. I will place orders and he will return next week for fasting labs.       RESOLVED: Congestive heart failure (HCC)   Relevant Medications   losartan  (COZAAR ) 100 MG tablet   Other Visit Diagnoses       Screening for prostate cancer         History of deep vein thrombosis         Attention deficit  hyperactivity disorder, predominantly inattentive type       Relevant Medications   losartan  (COZAAR ) 100 MG tablet           Return in about 6 months (around 06/16/2024).  SaraBeth Aleesa Sweigert, DNP, AGNP-c Time: 46 minutes, >50% spent counseling, care coordination, chart review, and documentation.  SABRAtime

## 2023-12-17 NOTE — Patient Instructions (Signed)
 For the Medrol  Dose Pack  Standard Medrol  Dose Pack Schedule Day Total Daily Dose Dosing Schedule 1 24 mg    8 mg three times daily (breakfast, lunch, dinner) 2 20 mg    4 mg at breakfast, 4 mg at lunch, 12 mg at dinner 3 16 mg    4 mg at breakfast, 4 mg at lunch, 8 mg at dinner 4 12 mg    4 mg at breakfast, 0 mg at lunch, 8 mg at dinner 5 8 mg    4 mg at breakfast, 0 mg at lunch, 4 mg at dinner 6 4 mg    4 mg at breakfast only 7 0 mg    No medication  Try the Valium first today at 10mg  and see if this helps to stop the spasm.  Don't take the valium and baclofen  and tramadol at the same time as this can make you too sleepy. See how each medication makes you feel before considering taking them together.   Don't take xanax  with the valium, this will interact.  Try not to take the ambien , if you can avoid it, while taking the other medications so you don't get too sedated.

## 2023-12-29 ENCOUNTER — Encounter: Payer: Self-pay | Admitting: Nurse Practitioner

## 2023-12-29 DIAGNOSIS — R7303 Prediabetes: Secondary | ICD-10-CM | POA: Insufficient documentation

## 2023-12-29 DIAGNOSIS — M62830 Muscle spasm of back: Secondary | ICD-10-CM | POA: Insufficient documentation

## 2023-12-29 NOTE — Assessment & Plan Note (Deleted)
 Currently well controlled.

## 2023-12-29 NOTE — Assessment & Plan Note (Signed)
 Chronic ADHD with adderall for management. He is tolerating his current dose well and does not feel this is exacerbating his anxiety, but rather helps to keep it better controlled. Will plan to continue at current dose and monitor. Repeat evaluation in 6 months.

## 2023-12-29 NOTE — Assessment & Plan Note (Signed)
Symptoms stable at this time with no concerns. No changes to plan of care.

## 2023-12-29 NOTE — Assessment & Plan Note (Signed)
 Chronic condition managed with cardiology. Rosuvastatin  for management with no concerns at this time. Labs can be completed next week when fasting.  - Continue rosuvastatin  - fasting labs in the next week or so

## 2023-12-29 NOTE — Assessment & Plan Note (Signed)
 Diet managed with no concerning symptoms at this time. I suspect his blood sugar will be elevated today as he has been on steroids for his back pain. We discussed checking the labs in 3 months, but he would like to go ahead and see where his levels are. I will place orders and he will return next week for fasting labs.

## 2023-12-29 NOTE — Assessment & Plan Note (Signed)
 Longstanding history of panic disorder and PTSD. He is currently managed with PRN alprazolam  and trazodone  at bedtime with ambien  if trazodone  is not effective. He is aware not to take ambien  and alprazolam  at the same time. No acute exacerbation at this time. Recommend close monitoring for worsening symptoms and report immediately.

## 2023-12-29 NOTE — Assessment & Plan Note (Signed)
 Blood pressure is well-controlled despite severe pain, which is surprising given the current level of discomfort. - Continue current antihypertensive regimen with losartan .

## 2023-12-29 NOTE — Assessment & Plan Note (Signed)
 Chronic insomnia associated with anxiety disorder. Currently managing with Ambien  PRN when trazodone  is not effective. No alarm symptoms present at this time. Will continue to monitor.

## 2023-12-29 NOTE — Assessment & Plan Note (Signed)
 Chronic thoracic back pain with acute exacerbation over the last three weeks, characterized by severe muscle spasms and disc protrusions at T8-T9. Previous treatments including epidural injections and physical therapy with dry needling have been ineffective. Current symptoms include severe muscle spasm, difficulty breathing, and inability to perform daily activities. MRI shows small right paracentral disc protrusion at T8-T9 contacting the spinal cord, with no stenosis. Current medications include methylprednisolone , which has provided limited relief. - Administer steroid injection intramuscularly to provide stronger anti-inflammatory effect. - Prescribe baclofen  as a central acting muscle relaxant, advising not to take with Flexeril . - Prescribe Valium for muscle relaxation, advising to test tolerance before combining with baclofen . - Prescribe tramadol for pain management. - Refer to a orthopedist for further evaluation and potential alternative treatments. - Advise use of heat therapy instead of ice to alleviate muscle spasm.

## 2023-12-29 NOTE — Assessment & Plan Note (Signed)
Patient has requested a refill of their as-needed anxiety medication. He is not taking his xanax daily, but very intermittently. At this time his anxiety symptoms are well controlled. He feels the current dose is effective.  Plan:  Refill Xanax PRN to manage anxiety symptoms as required.

## 2024-01-03 ENCOUNTER — Encounter: Payer: Self-pay | Admitting: Radiology

## 2024-01-03 ENCOUNTER — Encounter: Payer: Self-pay | Admitting: Nurse Practitioner

## 2024-01-03 DIAGNOSIS — M62830 Muscle spasm of back: Secondary | ICD-10-CM

## 2024-01-03 DIAGNOSIS — M546 Pain in thoracic spine: Secondary | ICD-10-CM

## 2024-01-03 DIAGNOSIS — G629 Polyneuropathy, unspecified: Secondary | ICD-10-CM

## 2024-01-03 DIAGNOSIS — M999 Biomechanical lesion, unspecified: Secondary | ICD-10-CM

## 2024-01-05 ENCOUNTER — Other Ambulatory Visit: Payer: Self-pay | Admitting: Nurse Practitioner

## 2024-01-05 DIAGNOSIS — F4312 Post-traumatic stress disorder, chronic: Secondary | ICD-10-CM

## 2024-01-05 DIAGNOSIS — F4001 Agoraphobia with panic disorder: Secondary | ICD-10-CM

## 2024-01-05 DIAGNOSIS — F900 Attention-deficit hyperactivity disorder, predominantly inattentive type: Secondary | ICD-10-CM

## 2024-01-05 DIAGNOSIS — F332 Major depressive disorder, recurrent severe without psychotic features: Secondary | ICD-10-CM

## 2024-01-05 DIAGNOSIS — F5104 Psychophysiologic insomnia: Secondary | ICD-10-CM

## 2024-01-05 MED ORDER — ALPRAZOLAM 0.5 MG PO TABS: 0.5000 mg | ORAL_TABLET | Freq: Two times a day (BID) | ORAL | 3 refills | Status: AC | PRN

## 2024-02-09 ENCOUNTER — Encounter: Payer: Self-pay | Admitting: Neurosurgery

## 2024-03-07 DIAGNOSIS — E78011 Heterozygous familial hypercholesterolemia (hefh): Secondary | ICD-10-CM

## 2024-03-07 DIAGNOSIS — I2510 Atherosclerotic heart disease of native coronary artery without angina pectoris: Secondary | ICD-10-CM

## 2024-03-09 NOTE — Progress Notes (Unsigned)
 "   Referring Physician:  Oris Camie BRAVO, NP [address] [place],  [place] [address]  Primary Physician:  Oris Camie BRAVO, NP  History of Present Illness: 03/09/2024 [name] is here today with a chief complaint of ***  Thoracic pain and muscle spasms.  Any numbness or tingling in legs?   Duration: ***?  Bowel/Bladder Dysfunction: none  Conservative measures:  Physical therapy: *** has participated in PT at [facility] Multimodal medical therapy including regular antiinflammatories: *** Tramadol , Methlprednisone, Flexeril  Injections: 03/25/2023 T8-9 ESI Tens unit Dry needling  Past Surgery: ***None  Dean Knox has ***no symptoms of cervical myelopathy.  The symptoms are causing a significant impact on the patient's life.   I have utilized the care everywhere function in epic to review the outside records available from external health systems.  Review of Systems:  A 10 point review of systems is negative, except for the pertinent positives and negatives detailed in the HPI.  Past Medical History: Past Medical History:  Diagnosis Date   Allergy 03/03/2007   Anxiety 2003   PTSD   Arthritis 2003   Bilevel positive airway pressure (BPAP) dependence 03/26/2021   Blood glucose abnormal 03/26/2021   Cancer (HCC) 2014   Skin. 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  Patanol not working.  I wrote a script for Zaditor to replace Patanol.  f/u in one week.   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  Allergies: Allergies as of 03/20/2024 - Review Complete 12/17/2023  Allergen Reaction Noted   Lisinopril Cough 03/25/2018   Penicillin g  02/14/2014   Penicillin v potassium  03/26/2021   Penicillins Rash 12/23/2015    Medications: Current Medications[1]  Social History: Social History[2]  Family Medical History: Family History  Problem Relation Age of Onset   Healthy Mother    Healthy Father    Healthy Sister     Physical Examination: There were no vitals filed for this visit.  General: Patient is in no apparent distress. Attention to examination is appropriate.  Neck:   Supple.  Full range of motion.  Respiratory: Patient is breathing without any difficulty.   NEUROLOGICAL:     Awake, alert, oriented to person, place, and time.  Speech is clear and fluent.   Cranial Nerves: Pupils equal round and reactive to light.  Facial tone is symmetric.  Facial sensation is symmetric. Shoulder shrug is symmetric. Tongue protrusion is midline.    Strength: Side Biceps Triceps Deltoid Interossei Grip Wrist Ext. Wrist Flex.  R 5 5 5 5 5  5  5  L 5 5 5 5 5 5 5    Side Iliopsoas Quads Hamstring PF DF EHL  R 5 5 5 5 5 5   L 5 5 5 5 5 5    Reflexes are ***2+ and symmetric at the biceps, triceps, brachioradialis, patella and achilles.   Hoffman's is absent. Clonus is absent  Bilateral upper and lower extremity sensation is intact to light touch ***.     No evidence of dysmetria noted.  Gait is normal.    Imaging: *** I have personally reviewed the images and agree with the above  interpretation.  Medical Decision Making/Assessment and Plan: [name] is a pleasant 64 y.o. male with ***  There are no diagnoses linked to this encounter.   Thank you for involving me in the care of this patient.    Penne MICAEL Sharps MD/MSCR Neurosurgery     [1]  Current Outpatient Medications:    ALPRAZolam  (XANAX ) 0.5 MG tablet, Take 1 tablet (0.5 mg total) by mouth 2 (two) times daily as needed for anxiety. Do not take with Ambien ., Disp: 45 tablet, Rfl: 3   amphetamine -dextroamphetamine  (ADDERALL XR) 30 MG 24 hr capsule, Take 1 capsule (30 mg total) by mouth every morning., Disp: 30 capsule, Rfl: 0   amphetamine -dextroamphetamine  (ADDERALL XR) 30 MG 24 hr capsule, Take 1 capsule (30 mg total) by mouth every morning., Disp: 30 capsule, Rfl: 0   amphetamine -dextroamphetamine  (ADDERALL XR) 30 MG 24 hr capsule, Take 1 capsule (30 mg total) by mouth every morning., Disp: 30 capsule, Rfl: 0   baclofen  (LIORESAL ) 20 MG tablet, Take 1 tablet (20 mg total) by mouth 3 (three) times daily., Disp: 30 each, Rfl: 0   diltiazem  (CARDIZEM ) 30 MG tablet, Take 1 tablet (30 mg total) by mouth every 4 (four) hours as needed (fast heart rate/afib)., Disp: 30 tablet, Rfl: 11   losartan  (COZAAR ) 100 MG tablet, Take 1 tablet (100 mg total) by mouth daily., Disp: 90 tablet, Rfl: 3   ondansetron  (ZOFRAN ) 8 MG tablet, Take 1 tablet (8 mg total) by mouth every 8 (eight) hours as needed for nausea or vomiting., Disp: 20 tablet, Rfl: 1   rosuvastatin  (CRESTOR ) 20 MG tablet, TAKE 1 TABLET(20 MG) BY MOUTH DAILY, Disp: 30 tablet, Rfl: 0   traMADol  (ULTRAM ) 50 MG tablet, Take 1 tablet (50 mg total) by mouth every 8 (eight) hours as needed. Avoid taking at the same time as valium ., Disp: 40 tablet, Rfl: 0   traZODone  (DESYREL ) 100 MG tablet, Take 1 tablet (100 mg total) by mouth at bedtime., Disp: 90 tablet, Rfl: 3   XARELTO  20 MG TABS tablet, Take 1 tablet (20 mg total) by mouth every morning., Disp: 90 tablet, Rfl:  3   zolpidem  (AMBIEN ) 5 MG tablet, TAKE 1 TABLET(5 MG) BY MOUTH AT BEDTIME AS NEEDED FOR SLEEP, Disp: 30 tablet, Rfl: 2 [2]  Social History Tobacco Use   Smoking status: Former    Current packs/day: 0.00    Average packs/day: 1 pack/day for 15.0 years (15.0 ttl pk-yrs)    Types: Cigarettes    Start date: 03/03/1987    Quit date: 03/02/2002    Years since quitting: 22.0   Smokeless tobacco: Never   Tobacco comments:    Have not smoked since quit date  Vaping Use   Vaping status: Never Used  Substance Use Topics   Alcohol use: Not Currently    Alcohol/week: 9.0 - 12.0 standard drinks of alcohol    Types: 9 -  12 Shots of liquor per week    Comment: Drink on a regular basis   Drug use: Never   "

## 2024-03-15 ENCOUNTER — Encounter: Payer: Self-pay | Admitting: Nurse Practitioner

## 2024-03-15 DIAGNOSIS — F5104 Psychophysiologic insomnia: Secondary | ICD-10-CM

## 2024-03-15 DIAGNOSIS — M546 Pain in thoracic spine: Secondary | ICD-10-CM

## 2024-03-15 MED ORDER — TRAZODONE HCL 50 MG PO TABS: 50.0000 mg | ORAL_TABLET | Freq: Every day | ORAL | 3 refills | Status: AC

## 2024-03-20 ENCOUNTER — Ambulatory Visit: Admitting: Neurosurgery

## 2024-03-27 ENCOUNTER — Ambulatory Visit: Admitting: Neurosurgery

## 2024-03-28 ENCOUNTER — Other Ambulatory Visit: Payer: Self-pay | Admitting: Nurse Practitioner

## 2024-03-28 DIAGNOSIS — F332 Major depressive disorder, recurrent severe without psychotic features: Secondary | ICD-10-CM

## 2024-03-28 DIAGNOSIS — F5104 Psychophysiologic insomnia: Secondary | ICD-10-CM

## 2024-03-28 DIAGNOSIS — F4312 Post-traumatic stress disorder, chronic: Secondary | ICD-10-CM

## 2024-03-28 DIAGNOSIS — F4001 Agoraphobia with panic disorder: Secondary | ICD-10-CM

## 2024-03-28 NOTE — Telephone Encounter (Signed)
 Last appt. 12/17/23.

## 2024-04-06 MED ORDER — METHYLPREDNISOLONE 4 MG PO TBPK: ORAL_TABLET | ORAL | 1 refills | Status: AC

## 2024-04-06 NOTE — Addendum Note (Signed)
 Addended by: ORIS DANKER E on: 04/06/2024 06:22 PM   Modules accepted: Orders

## 2024-04-07 ENCOUNTER — Other Ambulatory Visit (HOSPITAL_BASED_OUTPATIENT_CLINIC_OR_DEPARTMENT_OTHER): Payer: Self-pay | Admitting: Cardiology

## 2024-04-07 DIAGNOSIS — E78011 Heterozygous familial hypercholesterolemia (hefh): Secondary | ICD-10-CM

## 2024-04-07 DIAGNOSIS — I2510 Atherosclerotic heart disease of native coronary artery without angina pectoris: Secondary | ICD-10-CM

## 2024-04-10 ENCOUNTER — Ambulatory Visit: Admitting: Neurosurgery
